# Patient Record
Sex: Female | Born: 1975 | Race: Black or African American | Hispanic: No | Marital: Married | Smoking: Never smoker
Health system: Southern US, Community
[De-identification: ages and names within clinical notes are randomized; demographics above are authoritative.]

## PROBLEM LIST (undated history)

## (undated) DIAGNOSIS — T7840XA Allergy, unspecified, initial encounter: Secondary | ICD-10-CM

## (undated) DIAGNOSIS — E7800 Pure hypercholesterolemia, unspecified: Secondary | ICD-10-CM

## (undated) DIAGNOSIS — D649 Anemia, unspecified: Secondary | ICD-10-CM

## (undated) DIAGNOSIS — I10 Essential (primary) hypertension: Secondary | ICD-10-CM

## (undated) HISTORY — DX: Allergy, unspecified, initial encounter: T78.40XA

## (undated) HISTORY — DX: Pure hypercholesterolemia, unspecified: E78.00

## (undated) HISTORY — DX: Anemia, unspecified: D64.9

## (undated) HISTORY — DX: Essential (primary) hypertension: I10

---

## 2007-11-11 ENCOUNTER — Inpatient Hospital Stay (HOSPITAL_COMMUNITY): Admission: AD | Admit: 2007-11-11 | Discharge: 2007-11-12 | Payer: Self-pay | Admitting: Gynecology

## 2007-11-11 ENCOUNTER — Ambulatory Visit: Payer: Self-pay | Admitting: *Deleted

## 2007-11-29 ENCOUNTER — Ambulatory Visit: Payer: Self-pay | Admitting: *Deleted

## 2007-12-13 ENCOUNTER — Ambulatory Visit: Payer: Self-pay | Admitting: *Deleted

## 2007-12-27 ENCOUNTER — Encounter: Payer: Self-pay | Admitting: Family

## 2007-12-27 ENCOUNTER — Ambulatory Visit: Payer: Self-pay | Admitting: Obstetrics & Gynecology

## 2008-01-03 ENCOUNTER — Ambulatory Visit: Payer: Self-pay | Admitting: Obstetrics & Gynecology

## 2008-01-10 ENCOUNTER — Ambulatory Visit: Payer: Self-pay | Admitting: Obstetrics & Gynecology

## 2008-01-11 ENCOUNTER — Ambulatory Visit: Payer: Self-pay | Admitting: Obstetrics & Gynecology

## 2008-01-11 ENCOUNTER — Encounter: Payer: Self-pay | Admitting: Obstetrics & Gynecology

## 2008-01-11 ENCOUNTER — Inpatient Hospital Stay (HOSPITAL_COMMUNITY): Admission: RE | Admit: 2008-01-11 | Discharge: 2008-01-14 | Payer: Self-pay | Admitting: Obstetrics & Gynecology

## 2008-10-01 ENCOUNTER — Inpatient Hospital Stay (HOSPITAL_COMMUNITY): Admission: AD | Admit: 2008-10-01 | Discharge: 2008-10-01 | Payer: Self-pay | Admitting: Family Medicine

## 2008-11-15 IMAGING — US US OB COMP +14 WK
1 series · 14 of 28 positions shown · non-contrast
Comparison: none

OBSTETRICAL ULTRASOUND:

 This ultrasound exam was performed in the [HOSPITAL] Ultrasound Department.  The OB US report was generated in the AS system, and faxed to the ordering physician.  This report is also available in [REDACTED] PACS.

[Series 1: us ob comp +14 wk · 0.26mm/px · 14 of 57 slices shown]
[im 3/57]
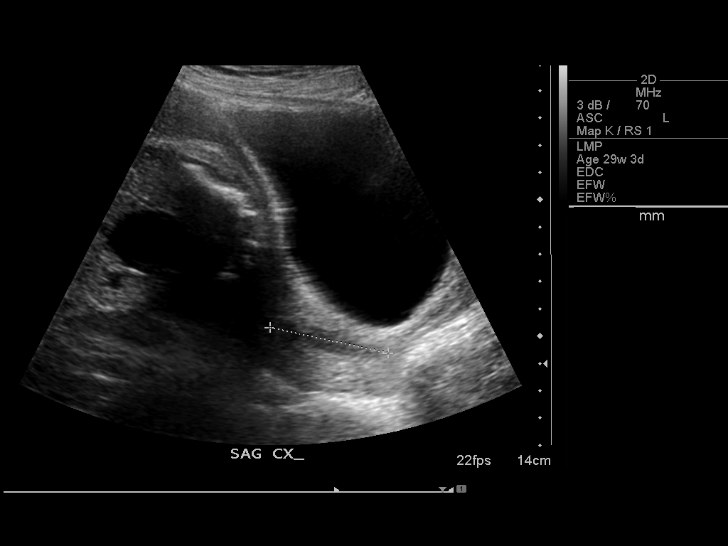
[im 7/57]
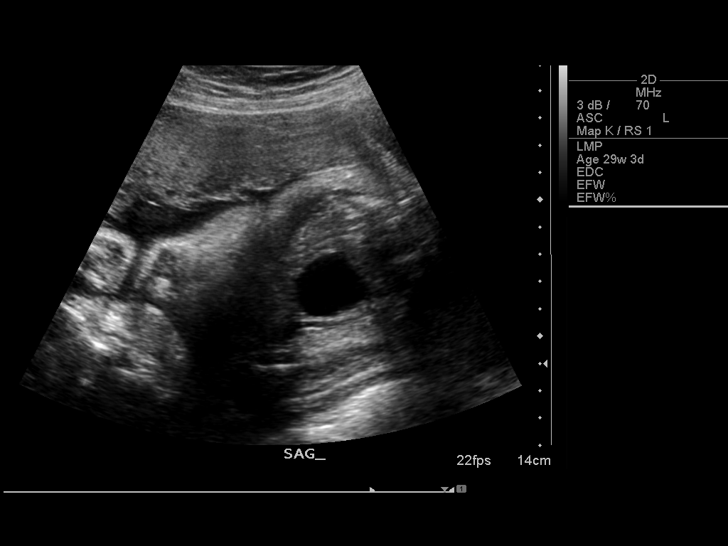
[im 11/57]
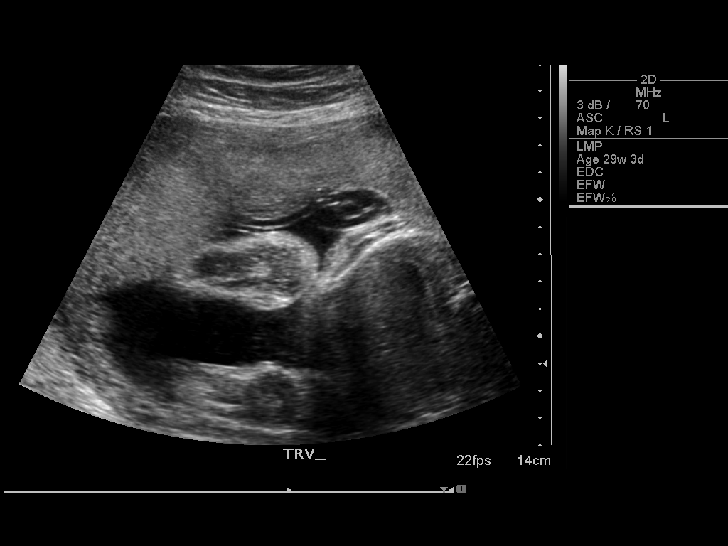
[im 15/57]
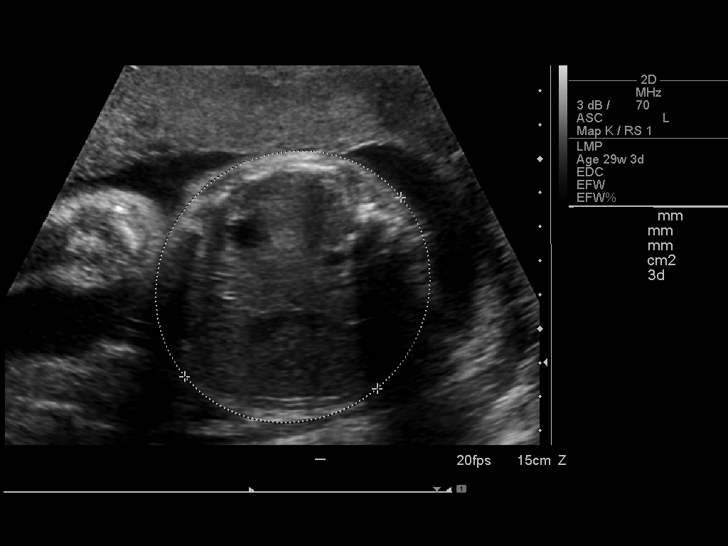
[im 19/57]
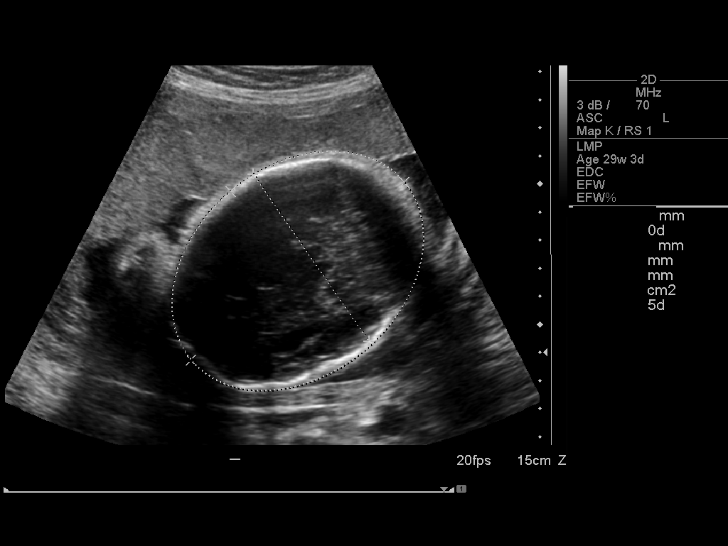
[im 23/57]
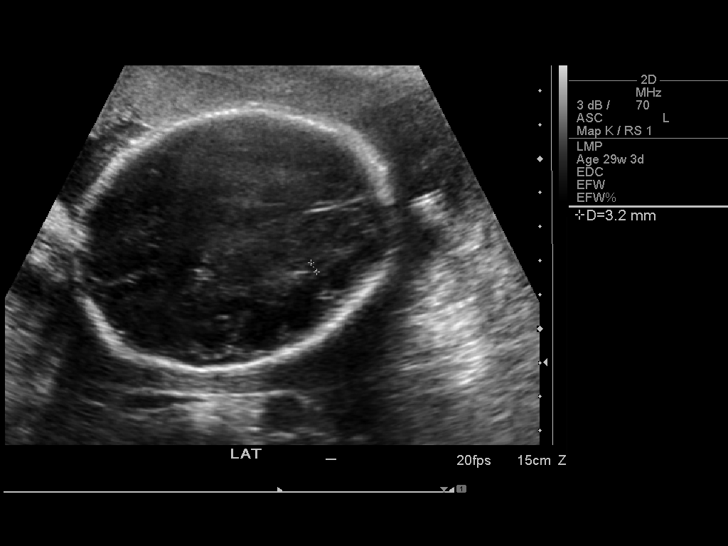
[im 27/57]
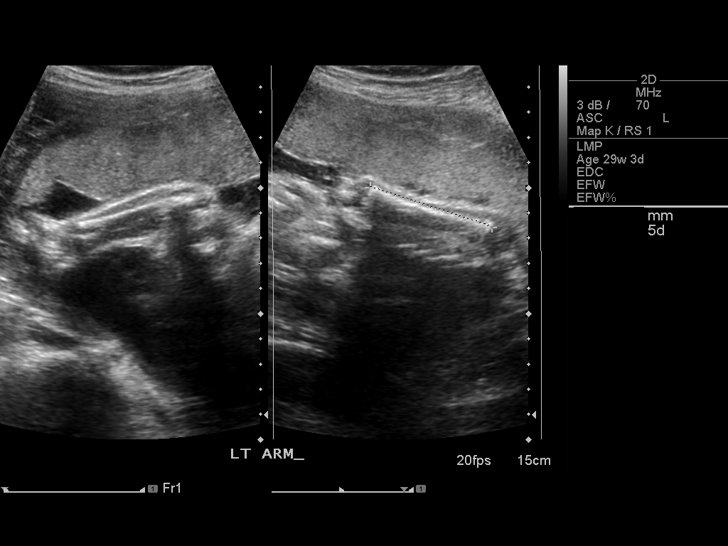
[im 32/57]
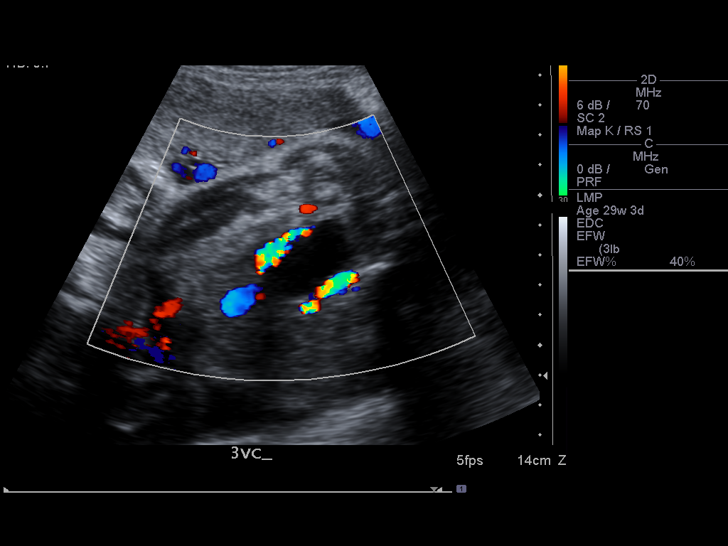
[im 36/57]
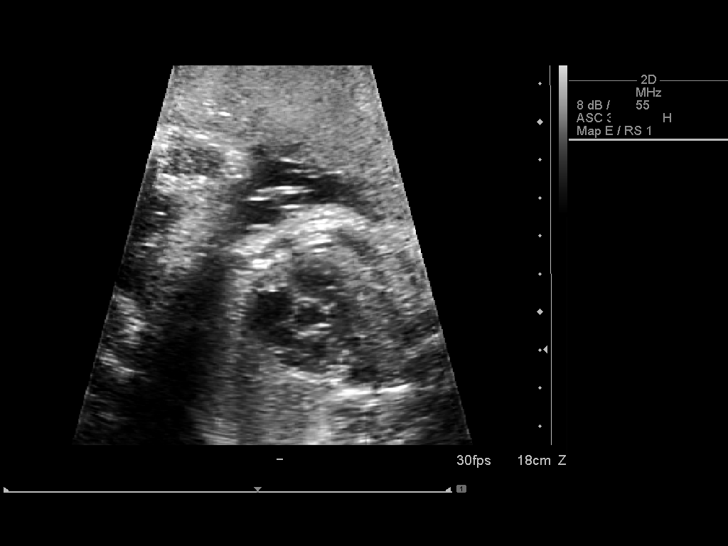
[im 40/57]
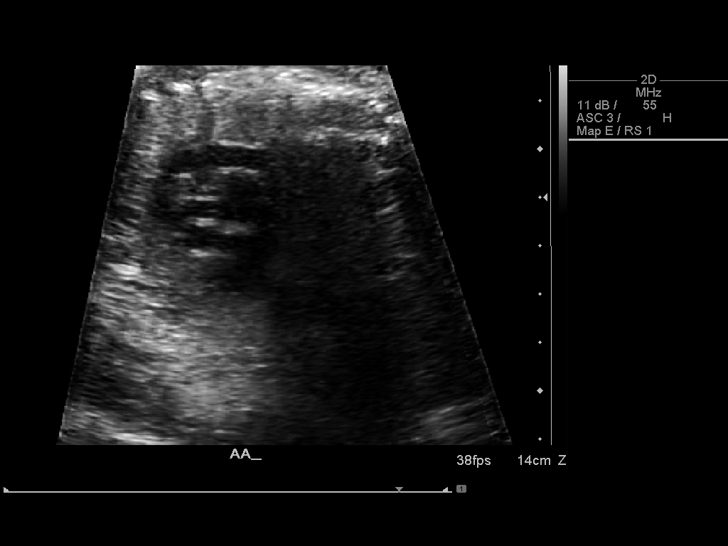
[im 44/57]
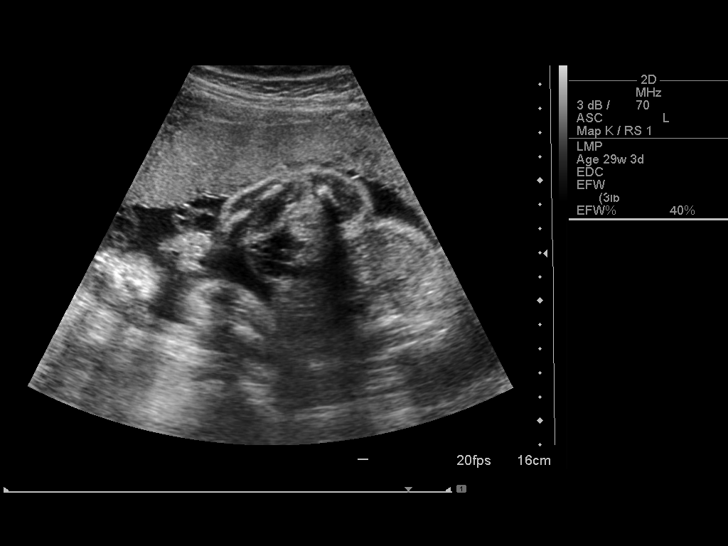
[im 48/57]
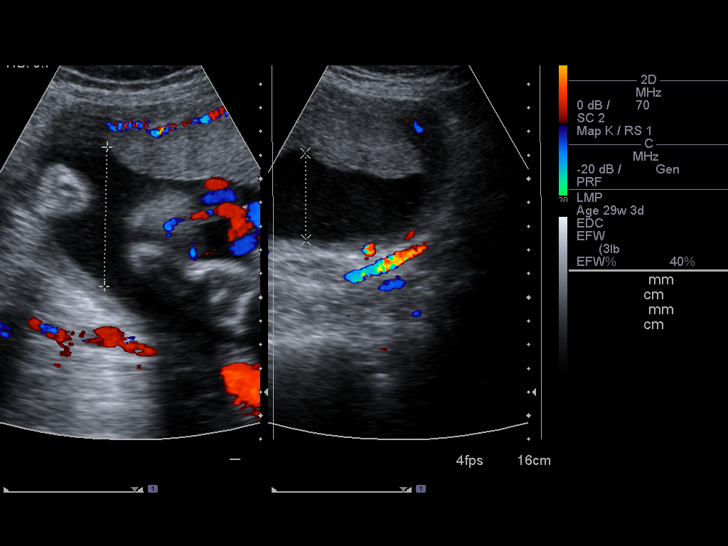
[im 52/57]
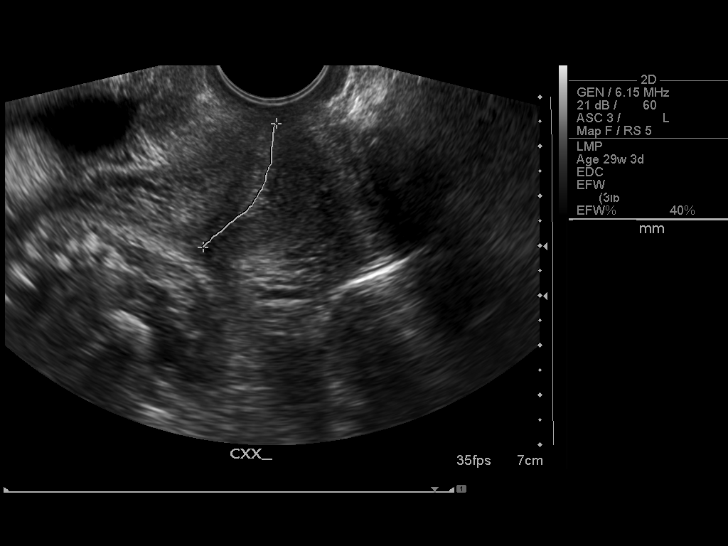
[im 57/57]
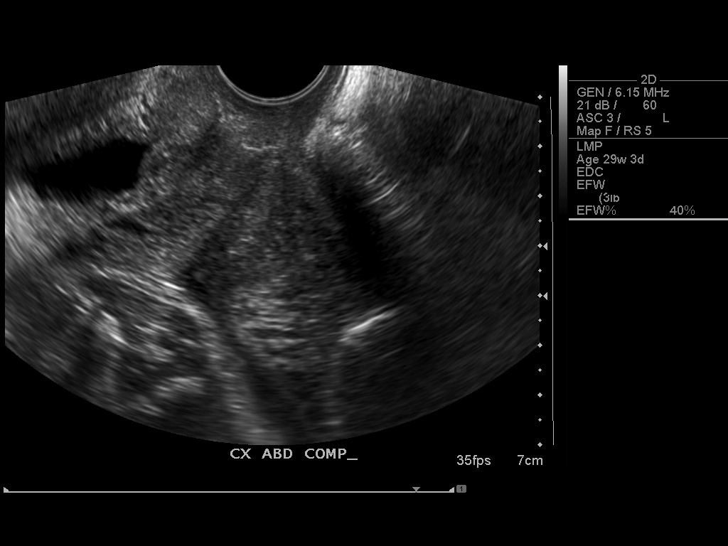

[14 of 28 positions shown; findings below may reference images not displayed]

IMPRESSION: See AS Obstetric US report.

## 2009-01-20 ENCOUNTER — Ambulatory Visit (HOSPITAL_COMMUNITY): Admission: RE | Admit: 2009-01-20 | Discharge: 2009-01-20 | Payer: Self-pay | Admitting: Family Medicine

## 2009-02-05 ENCOUNTER — Ambulatory Visit (HOSPITAL_COMMUNITY): Admission: RE | Admit: 2009-02-05 | Discharge: 2009-02-05 | Payer: Self-pay | Admitting: Family Medicine

## 2009-03-06 ENCOUNTER — Ambulatory Visit (HOSPITAL_COMMUNITY): Admission: RE | Admit: 2009-03-06 | Discharge: 2009-03-06 | Payer: Self-pay | Admitting: Family Medicine

## 2009-03-19 ENCOUNTER — Ambulatory Visit (HOSPITAL_COMMUNITY): Admission: RE | Admit: 2009-03-19 | Discharge: 2009-03-19 | Payer: Self-pay | Admitting: Family Medicine

## 2009-04-10 ENCOUNTER — Ambulatory Visit (HOSPITAL_COMMUNITY): Admission: RE | Admit: 2009-04-10 | Discharge: 2009-04-10 | Payer: Self-pay | Admitting: Family Medicine

## 2009-04-16 ENCOUNTER — Ambulatory Visit: Payer: Self-pay | Admitting: Family Medicine

## 2009-04-16 ENCOUNTER — Inpatient Hospital Stay (HOSPITAL_COMMUNITY): Admission: RE | Admit: 2009-04-16 | Discharge: 2009-04-19 | Payer: Self-pay | Admitting: Family Medicine

## 2010-03-10 IMAGING — US US OB FOLLOW-UP
1 series · 14 of 28 positions shown · non-contrast
Comparison: none

OBSTETRICAL ULTRASOUND:
 This ultrasound was performed in The [HOSPITAL], and the AS OB/GYN report will be stored to [REDACTED] PACS.

[Series 1: us ob follow-up · 14 of 60 slices shown]
[im 3/60]
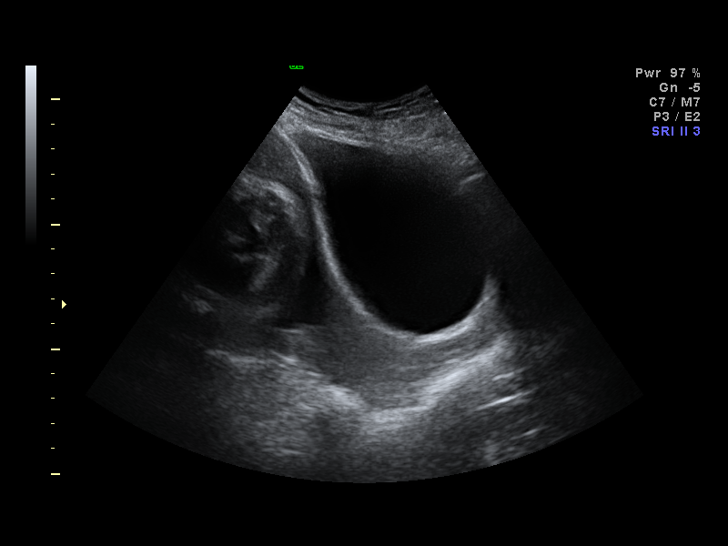
[im 7/60]
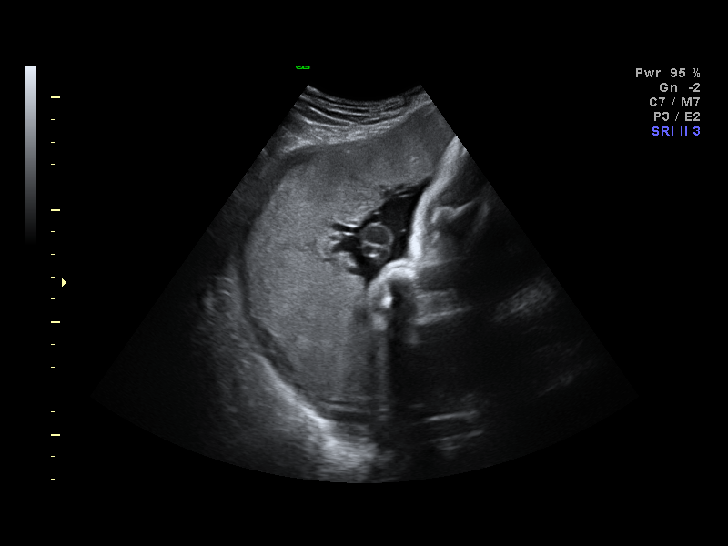
[im 11/60]
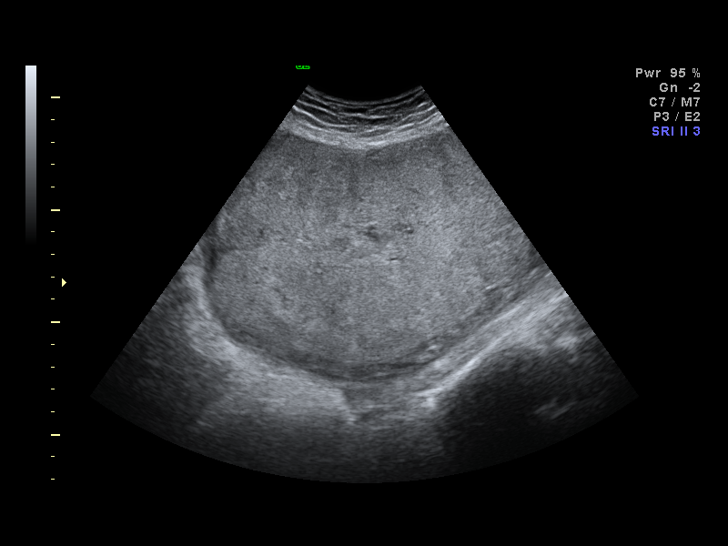
[im 16/60]
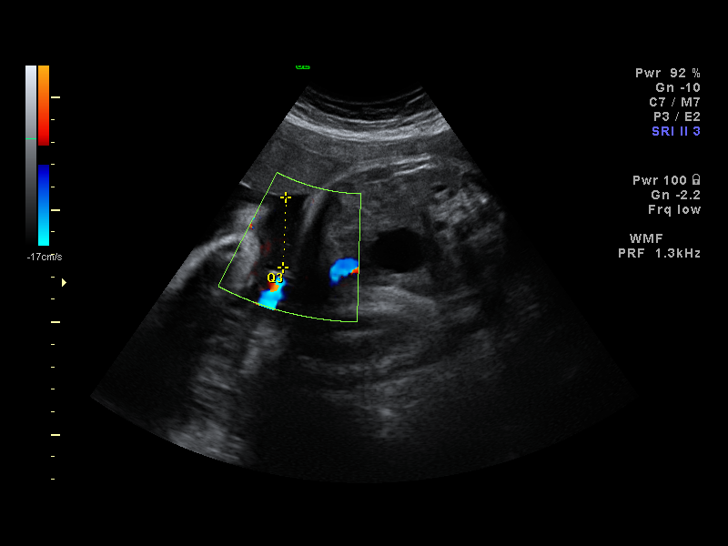
[im 20/60]
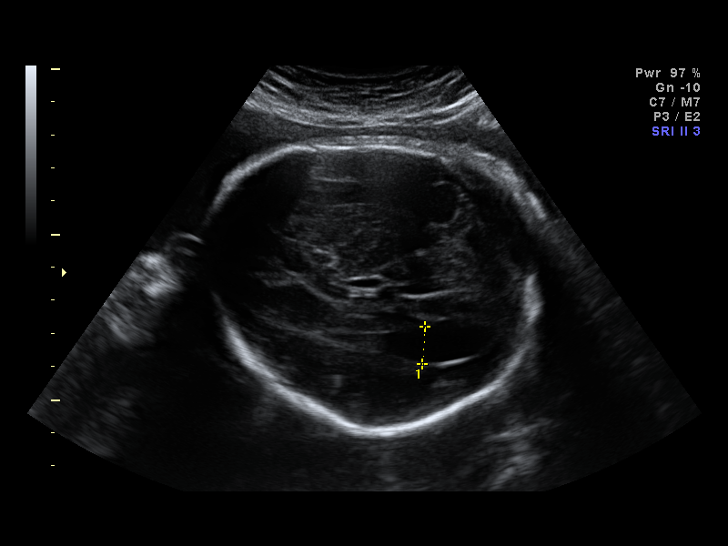
[im 25/60]
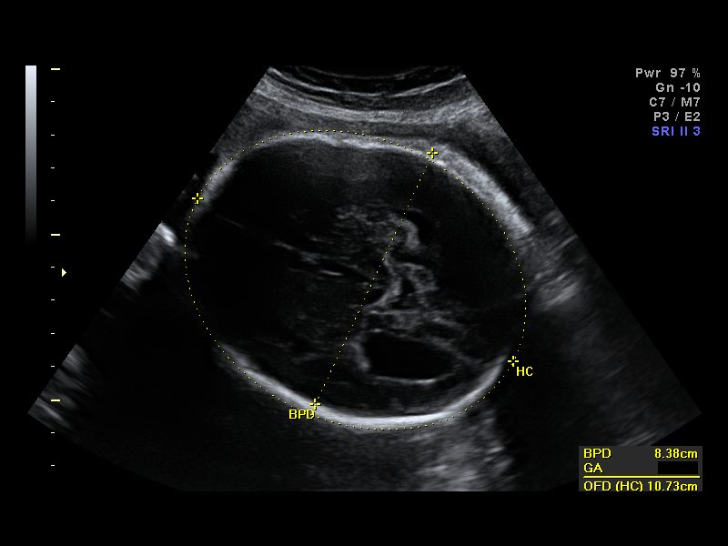
[im 29/60]
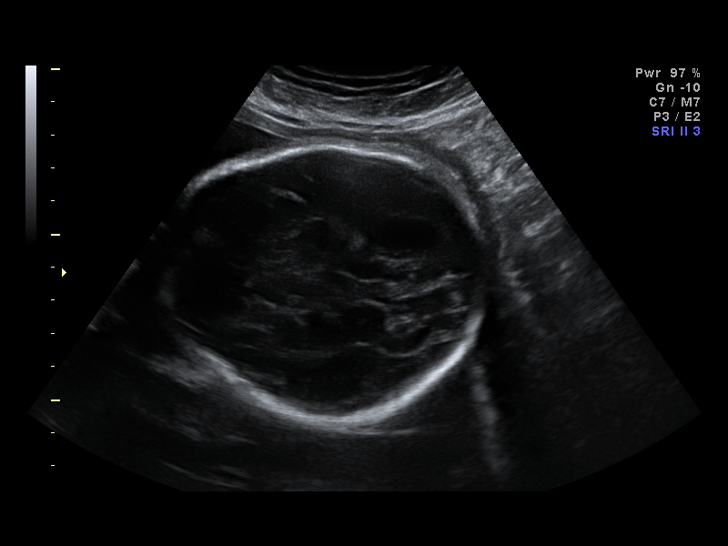
[im 33/60]
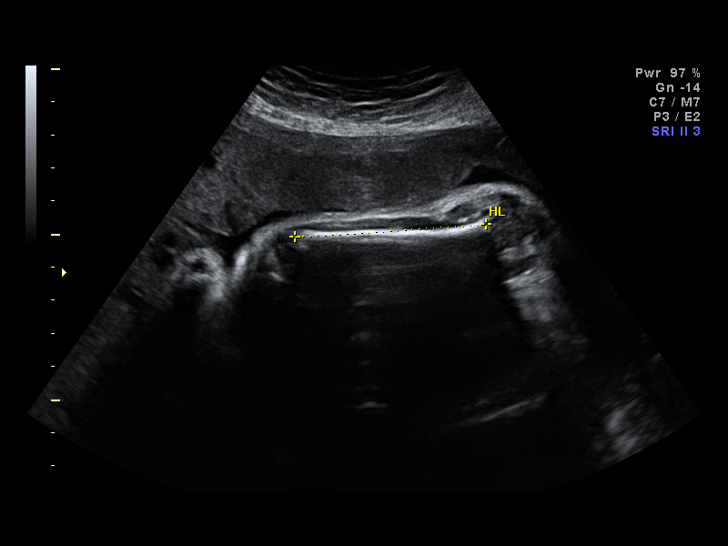
[im 38/60]
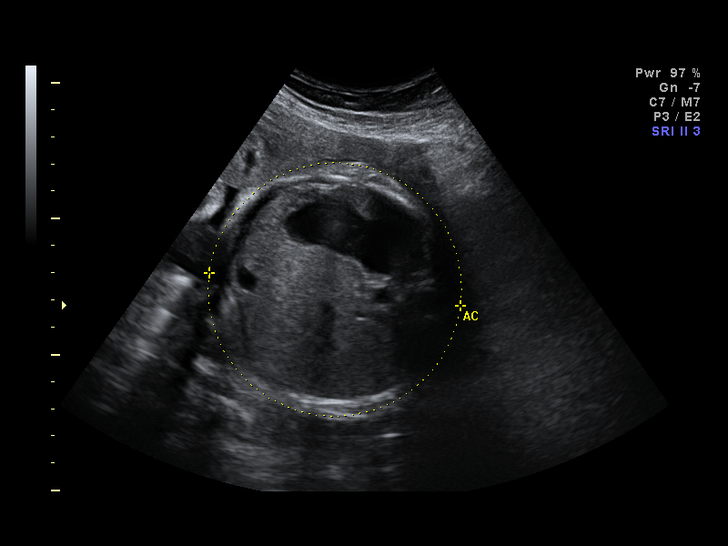
[im 42/60]
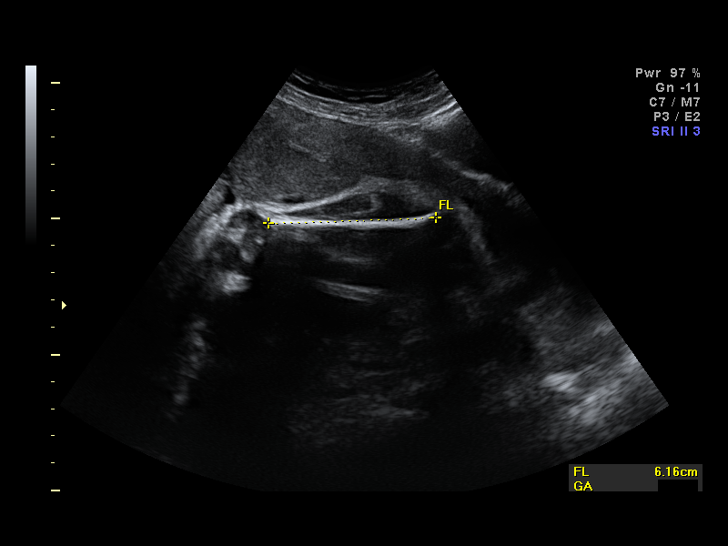
[im 46/60]
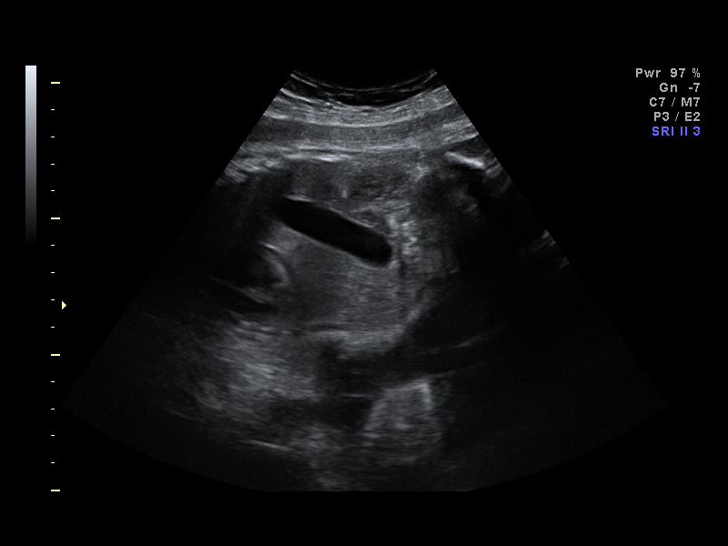
[im 51/60]
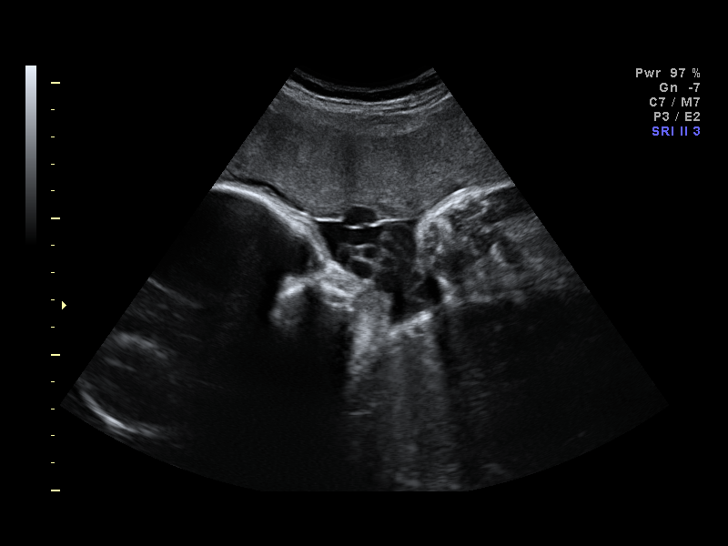
[im 55/60]
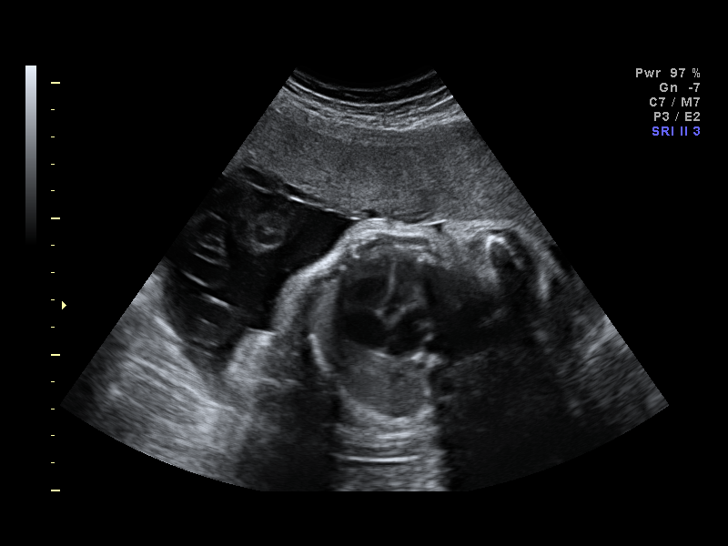
[im 60/60]
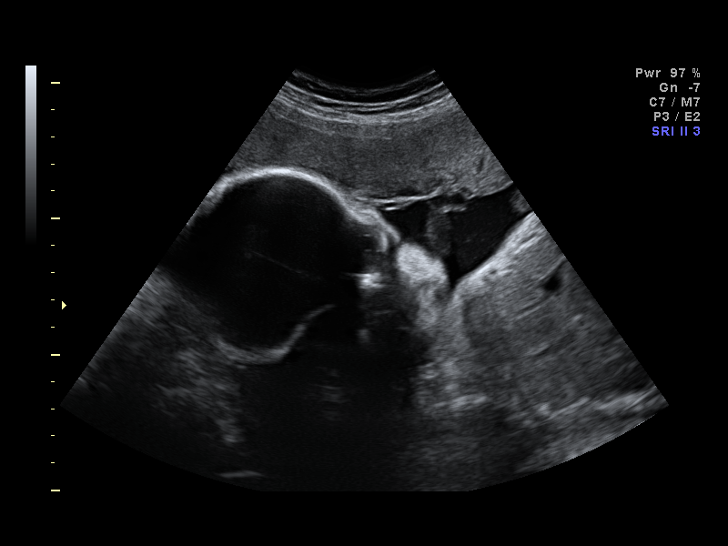

[14 of 28 positions shown; findings below may reference images not displayed]

IMPRESSION: AS OB/GYN has also been faxed to the ordering physician.

## 2010-08-10 ENCOUNTER — Ambulatory Visit: Payer: Self-pay | Admitting: Internal Medicine

## 2011-04-07 LAB — CBC
HCT: 37.6 % (ref 36.0–46.0)
Hemoglobin: 13.2 g/dL (ref 12.0–15.0)
MCV: 91.8 fL (ref 78.0–100.0)
Platelets: 111 10*3/uL — ABNORMAL LOW (ref 150–400)
RBC: 3.74 MIL/uL — ABNORMAL LOW (ref 3.87–5.11)
RBC: 4.1 MIL/uL (ref 3.87–5.11)
RDW: 15.7 % — ABNORMAL HIGH (ref 11.5–15.5)

## 2011-04-08 LAB — TOXOPLASMA ANTIBODIES- IGG AND  IGM
Toxoplasma Antibody- IgM: 0.3 IV
Toxoplasma Antibody- IgM: 0.2 IV
Toxoplasma IgG Ratio: 24.3 IU/mL
Toxoplasma IgG Ratio: 25.4 IU/mL

## 2011-04-08 LAB — CYTOMEGALOVIRUS ANTIBODY, IGG
Cytomegalovirus(CMV) Antibody, IgG: POSITIVE — AB
Cytomegalovirus(CMV) Antibody, IgG: POSITIVE — AB

## 2011-05-11 NOTE — Op Note (Signed)
NAMEZAMORAH, AILES                 ACCOUNT NO.:  [id number]   MEDICAL RECORD NO.:  [id number]          PATIENT TYPE:  INP   LOCATION:  9120                          [facility]   PHYSICIAN:  Tanya S. Shawnie Pons, M.D.   DATE OF BIRTH:  1976/02/06   DATE OF PROCEDURE:  04/16/2009  DATE OF DISCHARGE:                               OPERATIVE REPORT   PREOPERATIVE DIAGNOSES:  1. Intrauterine pregnancy at 39-0/[redacted] weeks gestational age.  2. Late presentation for prenatal care.  3. Breech presentation.  4. History of previous cesarean section.   POSTOPERATIVE DIAGNOSES:  1. Intrauterine pregnancy at 39-0/[redacted] weeks gestational age.  2. Late presentation for prenatal care.  3. Breech presentation.  4. History of previous cesarean section.  5. Loose nuchal cord.   PROCEDURE:  Repeat low-transverse cesarean section.   SURGEON:  Shelbie Proctor. Shawnie Pons, MD   ASSISTANT:  Odie Sera, DO   ANESTHESIA:  Spinal and local.   INDICATIONS FOR PROCEDURE:  Ms. Luddie Boghosian is a 35 year old gravida 3,  now para 2-1-0-3, who has a history of previous cesarean section for  breech presentation is present at 39-0/7 weeks with a breech  presentation for elective repeat low-transverse cesarean section.  She  has previously been counseled the risks and benefits of this procedure  to include, but not limited to bleeding, infection, damage to internal  organs with the potential need for further surgeries.  The patient was  explained these risks, via the separate interpreter line and voiced  understanding these risks and agreed to proceed with the procedure.   DESCRIPTION OF PROCEDURE:  The patient was taken to the operating room  where spinal anesthesia was introduced.  She was then prepped and draped  in the usual sterile manner and a time-out was conducted.  Appropriate  anesthesia was confirmed.  A Pfannenstiel incision was made in the skin  with the scalpel in the area of the previous cesarean scar.  The  incision was carried through subcutaneous layer down to the fascia.  The  fascia was incised in the midline with a scalpel.  The fascial incision  was then extended laterally using Mayo scissors.  The fascia was bluntly  and sharply dissected off the underlying rectus muscles both superior  and inferior to the rectus incision.  The rectus muscles were entered in  the midline bluntly and separated using manual traction.  The peritoneum  was then entered bluntly as well and separated with manual traction.  Appropriate entrance to the uterus was obtained and the Alexis retractor  was placed.  A transverse incision was made in the lower uterine segment  with a scalpel and carried through the layers, the last layer entered  bluntly with a finger.  Clear amniotic fluid was noted.  The uterine  incision was extended laterally using manual traction.  The fetus was  found to be in breech presentation.  The feet were then grasped and  delivered through the uterine incision.  Followed by the hips and  abdomen.  The abdomen was grasped with a wet towel for better traction.  The right arm was delivered by sweeping in front of the abdomen, then  followed by the left arm.  A loose nuchal cord was noted.  The head was  delivered atraumatically in a flexed position without difficulty.  The  mouth and nares were bulb suctioned and a spontaneous cry was noted.  The cord was clamped x2 and cut and the baby handed to the awaiting NICU  staff.  A cord blood was collected.  The placenta delivered with the  assistance of uterine massage.  The placenta was intact and had a three-  vessel cord.  The uterus was then cleared of clots and debris with a dry  lap sponge.  There were no septations or abnormalities noted on either  internally or externally on the uterus.  The uterine incision was then  closed in a single layer closure using a 0 Vicryl suture.  The closure  was done in a running interlocking manner.  Good  hemostasis was noted in  the uterine incision.  The bilateral adnexa and fallopian tubes were  identified and found to be grossly normal.  The Alexis retractor was  removed.  The peritoneum was closed using 0 Vicryl.  Good hemostasis was  noted.  The fascia was then closed using 0 Vicryl in a running non-  interlocking fashion.  There were no defects noted in the fascial  closure.  Good hemostasis was noted.  Several areas of oozing from the  subcutaneous tissues were identified and treated with electrocautery.  The skin was then closed with staples in the usual manner.  The superior  and inferior aspects of the incision were injected with 10 mL each of  0.25% Marcaine without epinephrine.  The same solution was injected with  5 mL at each anterior and superior iliac spine.  Pressure dressing was  applied.  All sponge, needle, and instrument counts were correct x2.   FINDINGS:  1. Clear amniotic fluid.  2. Viable female infant, weight 7 pounds 11 ounces.   SPECIMENS:  Placenta.   DISPOSITION:  To Labor and Delivery.   ESTIMATED BLOOD LOSS:  700 mL.   COMPLICATIONS:  No immediate complication.   The patient was taken to the PACU in good condition.      Odie Sera, DO  Electronically Signed     ______________________________  Shelbie Proctor. Shawnie Pons, M.D.    MC/MEDQ  D:  04/16/2009  T:  04/16/2009  Job:  [id number]

## 2011-05-11 NOTE — Discharge Summary (Signed)
[name], [name]                 ACCOUNT NO.:  [id number]   MEDICAL RECORD NO.:  [id number]          PATIENT TYPE:  INP   LOCATION:  9111                          [facility]   PHYSICIAN:  Lazaro Arms, M.D.   DATE OF BIRTH:  05-Jun-1976   DATE OF ADMISSION:  01/11/2008  DATE OF DISCHARGE:                               DISCHARGE SUMMARY   REASON FOR ADMISSION:  Pregnancy, approximately 38 to 39 weeks, for  failed version and cesarean section from fetal tachycardia.   DISCHARGE DIAGNOSES:  1. Pregnancy, approximately 38 to 39 weeks, for failed version and      cesarean section from fetal tachycardia.  2. Low transverse cesarean section for failed version.   HOSPITAL COURSE:  Has been uneventful.  Patient has progressed well.  Vital signs are stable.  She has been afebrile, up ambulating in halls  without any difficulties.  Her incision is dry and intact, no redness or  swelling or discharge.   PHYSICAL EXAM TODAY:  VITAL SIGNS:  Stable.  HEART:  Regular rhythm and rate.  LUNGS:  Clear to auscultation bilaterally.  ABDOMEN:  Soft, bowel sounds are present in all 4 quadrants.  Incision  is intact, no redness, swelling or discharge, lochia small amount.  There is trace edema in lower extremities.   DISCHARGING MEDICATIONS:  As are follows:  1. Lortab 5/500, one p.o. q.4h. p.r.n. pain.  2. Prenatal Vitamin.  3. Ibuprofen 600, one p.o. q.6h. p.r.n. pain.  4. Colace 100 b.i.d.   DISCHARGE:  She is discharged today [facility] is supposed to see her postop  day 5 through 7 to remove her staples and re-evaluate the patient.      [name], N.M.      Lazaro Arms, M.D.  Electronically Signed    DL/MEDQ  D:  04/54/0981  T:  01/14/2008  Job:  [id number]

## 2011-05-11 NOTE — Op Note (Signed)
NAMEALYDIA, GOSSER                 ACCOUNT NO.:  [id number]   MEDICAL RECORD NO.:  [id number]          PATIENT TYPE:  INP   LOCATION:  9111                          [facility]   PHYSICIAN:  Lesly Dukes, M.D. DATE OF BIRTH:  [birth year]   DATE OF PROCEDURE:  01/11/2008  DATE OF DISCHARGE:                               OPERATIVE REPORT   PREOPERATIVE DIAGNOSES:  1. Intrauterine pregnancy at 66 weeks' gestation.  2. Fetal tachycardia.  3. Breech presentation, status post failed version.   POSTOPERATIVE DIAGNOSES:  1. Intrauterine pregnancy at 33 weeks' gestation.  2. Fetal tachycardia.  3. Breech presentation, status post failed version.   PROCEDURE:  Primary low transverse cesarean section.   SURGEON:  Lesly Dukes, MD   ASSISTANT:  Karlton Lemon, MD   ANESTHESIA:  Epidural.   FINDINGS:  1. A viable infant female, weight 7 pounds 15 ounces, with Apgars of 9      at one minute and 9 at five minutes with a cord pH of 7.25.  2. Clear amniotic fluid.  3. Normal female pelvic anatomy.   ESTIMATED BLOOD LOSS:  800 mL.   DRAINS:  Foley with 200 mL of clear yellow urine.   COMPLICATIONS:  None immediate.   SPECIMENS:  Placenta to labor and delivery.  Cord blood and gas to the  lab.   INDICATION FOR PROCEDURE:  This is a 35 year old gravida 2, para 0-1-0-  1, presenting at 28 weeks' gestation in breech presentation.  She  presented for attempted version, which was unsuccessful.  After the  version there was persistent fetal tachycardia in the 170s to 180s  necessitating the need for a cesarean section.   DESCRIPTION OF PROCEDURE:  The patient was taken to the operating room  and after obtaining adequate epidural anesthesia was prepped and draped  in the usual sterile manner in the supine position with left lateral  uterine displacement.  After assuring adequate anesthesia, a  Pfannenstiel skin incision was made using a scalpel.  The incision was  carried down  through the subcutaneous tissues using a scalpel.  The  rectus fascia was nicked in the midline and the incision was extended  laterally in each direction using Mayo scissors.  The rectus muscle was  dissected free of the fascia using both sharp and blunt dissection.  The  rectus muscles were separated bluntly.  The parietal peritoneum was  identified and grasped between two hemostats, elevated, and entered  under direct visualization with Metzenbaum scissors.  At this point a  bladder blade was placed.  A reflection of the visceral peritoneum  superior to the bladder was identified, elevated, and then incised using  the Metzenbaum scissors and the incision was extended laterally.  The  bladder flap was created using blunt dissection and retracted with the  bladder blade.  A low transverse uterine incision was made using a  scalpel and the incision was extended lateral and superior using blunt  dissection and bandage scissors.  A hand was placed in the uterine  cavity and the infant was found to be  in frank breech position.  Hooking  the hips with index fingers, the buttocks were delivered.  The left leg  was then swept down and out of the uterine cavity.  This was followed by  the right leg being swept out of the uterine cavity.  A dry towel was  used to provide traction and the infant was abdomen and chest were then  delivered.  The infant's right arm was then delivered by sweeping it  down across the chest.  The left arm was then delivered in the same  fashion.  The head was then delivered atraumatically after providing  flexion of the head.  The infant was bulb-suctioned after delivery.  The  cord was doubly clamped and cut and the infant handed to the nursery  team in attendance.  Specimens were collected for cord blood and cord  pH.  The placenta was delivered manually and appeared intact.  The  placenta was delivered by uterine massage and appeared intact.  The  edges of the uterine  incision were then clamped with ring clamps x2.  The endometrial cavity was wiped free of any trace of membranes using  wet laparotomy sponges.  The uterine incision was closed in two layers,  the first being a running locking stitch of 0 Vicryl and the second  being a running imbricating stitch of 0 Vicryl.  The uterine incision  was inspected and found to have adequate hemostasis.  The operative site  was irrigated with copious amounts of normal saline and the uterine  incision was then inspected again and found to have adequate hemostasis.  The rectus muscles were inspected and small areas of bleeding controlled  using Bovie cautery.  The rectus fascia was reapproximated with one  suture of 0 Vicryl in a running, unlocked fashion.  There was a small  area of fascial defect at the area where the lateral rectus muscle  attaches to the fascia.  This was closed with one stitch of 0 Vicryl.  The subcutaneous tissue was then inspected and small areas of bleeding  were controlled using Bovie cautery.  The skin was reapproximated with  stainless steel skin staples.  Sponge, needle and instrument counts were  correct x2.  The patient tolerated the procedure well and went to  recovery in stable condition.      Karlton Lemon, MD  Electronically Signed     ______________________________  Lesly Dukes, M.D.    NS/MEDQ  D:  01/12/2008  T:  01/12/2008  Job:  [id number]

## 2011-05-11 NOTE — Discharge Summary (Signed)
NAMETESNEEM, DUFRANE                 ACCOUNT NO.:  [id number]   MEDICAL RECORD NO.:  [id number]         PATIENT TYPE:  WINP   LOCATION:                                [facility]   PHYSICIAN:  Norton Blizzard, MD    DATE OF BIRTH:  May 30, 1976   DATE OF ADMISSION:  04/16/2009  DATE OF DISCHARGE:  04/19/2009                               DISCHARGE SUMMARY   REASON FOR HOSPITALIZATION:  Repeat low transverse cesarean section  secondary to breech presentation.   PERTINENT LABORATORY DATA:  Admission hemoglobin 13.2, postop hemoglobin  12.1.   FINAL DIAGNOSES:  1. Lower transverse cesarean section.  2. History of previous cesarean section,  3. Breech presentation.  4. Late prenatal care.   SIGNIFICANT FINDINGS:  Viable female infant delivered via C-section on  April 16, 2009, weight 7 pounds 11 ounces, Apgars 8 and 9, loose nuchal  cord. EBL 700 mL.   PROCEDURES PERFORMED:  Lower transverse cesarean section.   POSTPARTUM COURSE:  The patient had a normal postpartum course,  unremarkable without any problems.   CONDITION OF THE PATIENT ON DISCHARGE:  The patient is alert and  oriented x3.  Vital signs were stable, ambulating without difficulty,  reported decreased bleeding, and pain well controlled with pain  medications.  Routine postpartum instructions given to the patient.  The  patient is to follow up at [facility] in 6 weeks  for postpartum visit, incisional staples removed prior to discharge.  She was told to avoid lifting any object greater than 10 pounds.  Prescriptions for Ibuprofen, Percocet, and Micronor given at discharge;  she was instructed to take Micronor everyday at the same time to  maximize efficacy and avoid breakthrough bleeding.      Sid Falcon, CNM      Norton Blizzard, MD  Electronically Signed    WM/MEDQ  D:  04/19/2009  T:  04/19/2009  Job:  [phone]

## 2011-09-16 LAB — CBC
HCT: 30.1 — ABNORMAL LOW
HCT: 35.6 — ABNORMAL LOW
MCHC: 34.8
MCHC: 35
MCHC: 35.1
MCV: 88.7
MCV: 89.5
RBC: 3.78 — ABNORMAL LOW
RDW: 13.6
RDW: 13.6
RDW: 13.8
WBC: 6.2
WBC: 7.7

## 2011-09-16 LAB — POCT URINALYSIS DIP (DEVICE)
Bilirubin Urine: NEGATIVE
Glucose, UA: 100 — AB
Glucose, UA: 500 — AB
Hgb urine dipstick: NEGATIVE
Hgb urine dipstick: NEGATIVE
Ketones, ur: NEGATIVE
Nitrite: NEGATIVE
Operator id: 14811
Specific Gravity, Urine: 1.02
Urobilinogen, UA: 0.2
Urobilinogen, UA: 1
pH: 7

## 2011-09-27 LAB — URINALYSIS, ROUTINE W REFLEX MICROSCOPIC
Glucose, UA: 250 — AB
Ketones, ur: 40 — AB
Leukocytes, UA: NEGATIVE
Protein, ur: 30 — AB
Urobilinogen, UA: 1

## 2011-09-27 LAB — URINE CULTURE
Colony Count: NO GROWTH
Culture: NO GROWTH

## 2011-09-27 LAB — WET PREP, GENITAL: Yeast Wet Prep HPF POC: NONE SEEN

## 2011-09-27 LAB — GC/CHLAMYDIA PROBE AMP, GENITAL: Chlamydia, DNA Probe: NEGATIVE

## 2011-09-27 LAB — URINE MICROSCOPIC-ADD ON

## 2011-10-01 LAB — POCT URINALYSIS DIP (DEVICE)
Bilirubin Urine: NEGATIVE
Bilirubin Urine: NEGATIVE
Ketones, ur: NEGATIVE
Ketones, ur: NEGATIVE
Operator id: 120861
Operator id: 148111
Specific Gravity, Urine: 1.015
Specific Gravity, Urine: 1.02

## 2011-10-04 LAB — POCT URINALYSIS DIP (DEVICE)
Bilirubin Urine: NEGATIVE
Glucose, UA: 500 — AB
Ketones, ur: NEGATIVE
Operator id: 297281
Specific Gravity, Urine: 1.025

## 2011-10-05 LAB — URINALYSIS, ROUTINE W REFLEX MICROSCOPIC
Ketones, ur: NEGATIVE
Nitrite: NEGATIVE
Specific Gravity, Urine: <1.005 — ABNORMAL LOW
pH: 6.5

## 2011-10-05 LAB — RAPID URINE DRUG SCREEN, HOSP PERFORMED
Cocaine: NOT DETECTED
Tetrahydrocannabinol: NOT DETECTED

## 2011-10-05 LAB — CBC
HCT: 35.3 — ABNORMAL LOW
Platelets: 147 — ABNORMAL LOW
RBC: 3.88
WBC: 5.8

## 2011-10-05 LAB — DIFFERENTIAL
Eosinophils Relative: 2
Lymphocytes Relative: 25
Lymphs Abs: 1.4
Neutrophils Relative %: 63

## 2011-10-05 LAB — SICKLE CELL SCREEN: Sickle Cell Screen: NEGATIVE

## 2011-10-05 LAB — HEPATITIS B SURFACE ANTIGEN: Hepatitis B Surface Ag: NEGATIVE

## 2011-10-05 LAB — GC/CHLAMYDIA PROBE AMP, GENITAL: GC Probe Amp, Genital: NEGATIVE

## 2011-10-05 LAB — WET PREP, GENITAL

## 2011-10-05 LAB — TYPE AND SCREEN: ABO/RH(D): B POS

## 2011-10-05 LAB — RPR: RPR Ser Ql: NONREACTIVE

## 2011-10-05 LAB — URINE MICROSCOPIC-ADD ON

## 2018-04-17 ENCOUNTER — Ambulatory Visit: Payer: Medicaid Other | Admitting: Family Medicine

## 2018-04-17 ENCOUNTER — Encounter: Payer: Self-pay | Admitting: Family Medicine

## 2018-04-17 ENCOUNTER — Other Ambulatory Visit: Payer: Self-pay

## 2018-04-17 VITALS — BP 110/64 | HR 87 | Temp 98.2°F | Ht 67.5 in | Wt 159.0 lb

## 2018-04-17 DIAGNOSIS — I10 Essential (primary) hypertension: Secondary | ICD-10-CM

## 2018-04-17 DIAGNOSIS — E7800 Pure hypercholesterolemia, unspecified: Secondary | ICD-10-CM | POA: Insufficient documentation

## 2018-04-17 MED ORDER — PRAVASTATIN SODIUM 10 MG PO TABS: 10.0000 mg | ORAL_TABLET | Freq: Every day | ORAL | 2 refills | Status: DC

## 2018-04-17 NOTE — Patient Instructions (Addendum)
Thank you for coming to see me today. It was a pleasure meeting you! Today we talked about:   Your high blood pressure.  Please come in sometime this week for nurse visit in order to have your blood pressure checked without taking your medication.  At that time please also bring in your birth control so that I may refill this for you.   We will call you with your lab results.  Please also see 1 of the travel clinics given a handout in order to receive your vaccinations and malaria prophylaxis.  Please follow-up with me in 6 months or sooner as needed.  If you have any questions or concerns, please do not hesitate to call the office at [phone].  Take Care,   [place] Javonn Gauger, DO  International Travel  The primary function of the International Travel Program is to provide customized travel health consultations to individuals traveling abroad. Destination specific immunizations are administered and preventative medicine services are offered. Additionally, the latest health and safety recommendations issued by the Centers for Disease Control for each destination is provided. We offer travel services for individual vacationers, business travelers, church groups and missionary teams. Services include immunizations, international certificate of vaccination, malaria prevention medication, and travel consultation. Consultations are with experienced nursing staff and include travel packets of reference information and the most current travel information from: the Centers for Disease Control (CDC); the World Health Organization (WHO); the [place] State Department and the [employer] Administration (TSA).  To better serve you, we accept the following insurances: [employer] (BCBS) and [employer]. If you have BCBS or UnitedHealthcare, you may be responsible for your deductible and/or co-payment at the time of service. Aetna, Cigna and [employer]state. Patients with these insurance  plans will need to pay for their visit in full at the time of service. A claim will be filed on the patient's behalf and if payment is received, the patient will receive a refund. As a result of your clinic visit and if you are eligible, some travel medications may also be available for you at reduced costs from the [facility]. Please note that our [facility] does not accept insurance prescription cards.  Early Planning  Early planning for travel is the best prevention and protection against disease. Some immunizations are a 2 or 3 dose series that are given over a specific period of time. It is important to complete the series in order of have full protection against disease. It is best to allow at least three to six months for all necessary immunizations. Fees charged for travel services are competitive for this comprehensive, professional service. Cash, checks, and the above insurances are accepted. You do not have to be a [facility] resident to be eligible for these services. We are unable to offer consultations by phone; however, more information on recommended travel immunizations is available through the [facility] Traveler's Health website or by calling toll free 1-877-FYI-TRIP 479-144-4811). A toll-free fax number for requesting information is [phone].  Clinic Days and Times For your convenience, we offer services in both our [place] and [address] locations. In [place] we are located at [employer]. Our [place] clinic is located at [address] Call [phone], Monday-Friday for individual appointments. For more information on disease outbreaks around the world, visit Health Map. International Travel Questions: Do you have questions about our International Travel services? Please Submit your questions. Our staff will respond to your question as  soon as possible.  Are you a previous International Travel customer? If so,  complete our Services Survey and let us know how we can improve!  To respond by mail, please click on the survey below, download and print it, complete it and return it by mail to Mariel AloeLora Coffey, [facility] [employer], [id number], [place], [place] [id number] Services Survey To respond via email, please click on the survey below, save it to your computer, open it in the [employer] Reader program, complete it and then email it back as an attachment to [email or web address] Email Services Survey

## 2018-04-17 NOTE — Assessment & Plan Note (Signed)
Per patient history.  She has been on pravastatin for years.  We will continue pravastatin 10 mg and will obtain lipid panel this morning.

## 2018-04-17 NOTE — Progress Notes (Signed)
Subjective:    Patient ID: [name], female    DOB: 1976-05-11, [age] y.o.   MRN: [id number]   CC: establish care   HPI:  Health Maintenance: Reports that Pap was last year in [place] in 2018, and was normal Patient going to [place] in 2 months.  Reports that she goes every other year. She needs malaria prophylaxis and meningitis vaccinations.   Hypertension: - Medications: Previously on lisinopril 20 mg and HCTZ 25 mg.  Patient reports that this was started 3 months ago due to low blood pressure.  However she reports that she thinks she still needs this.  She took her medications this morning but has not taken it since she was told to stop 3 months ago.  BP today is 110/64 and patient encouraged to return for nurse visit for BP check when off medication.    Hypercholesterolemia: - Patient taking pravastatin 10 mg daily  Trouble hearing in R ear: Patient reports it has been getting slowly worse, denies any other symptoms or pain - Denies tinnitus.   Review of Systems  Constitutional: Negative for fever.  HENT: Positive for hearing loss. Negative for congestion.        In R ear  Eyes: Negative for blurred vision and double vision.  Respiratory: Negative for shortness of breath.   Cardiovascular: Negative for chest pain.  Gastrointestinal: Negative for abdominal pain.  Neurological: Negative for dizziness and headaches.   Patient Active Problem List   Diagnosis Date Noted  . Pure hypercholesterolemia 04/17/2018     Family History  Problem Relation Age of Onset  . High blood pressure Mother     Past Medical History:  Diagnosis Date  . Hypercholesteremia   . Hypertension     Social Hx: Denies tobacco use, alcohol use, or illicit drug use.   Objective:  BP 110/64   Pulse 87   Temp 98.2 F (36.8 C) (Oral)   Ht 5' 7.5" (1.715 m)   Wt 159 lb (72.1 kg)   LMP 04/09/2018 (Exact Date)   SpO2 99%   BMI 24.54 kg/m  Vitals and nursing note reviewed  General: NAD,  pleasant Ears: R ear with cerumen impaction, L ear with normal TM's and no erythema Head: Atraumatic Neck: Supple Cardiac: RRR, normal heart sounds, no murmurs Respiratory: CTAB, normal effort Abdomen: soft, nontender, nondistended. Bowel sounds present Extremities: no edema or cyanosis. WWP. MSK: normal gait Skin: warm and dry, no rashes noted Neuro: alert and oriented, no focal deficits Psych: Neatly groomed and appropriately dressed. Maintains good eye contact and is cooperative and attentive. Speech is normal volume and rate. Denies SI/ HI. Normal affect.  Assessment & Plan:    Pure hypercholesterolemia Per patient history.  She has been on pravastatin for years.  We will continue pravastatin 10 mg and will obtain lipid panel this morning.  HTN:  patient to return for nurse visit for blood pressure check.  She is encouraged not to take her medication on the day of blood pressure check.  We will not refill at this time given blood pressure today of 110/64. Will obtain CMP, CBC and Lipid panel today.   Health Maintenance: Patient is to return with name of OCPs that she is on.  Reports that she had a normal Pap smear 1 year ago.  Resources on travel clinics to go to in order to receive proper travel prophylaxis and vaccinations prior to going to [place]  R ear cleaned out with water irrigation. Patient reports  relief of trouble hearing. Ear with normal TM and no erythema after cleaning.   Crystal Mauri Tolen, DO Family Medicine Resident, PGY-1

## 2018-04-18 LAB — COMPREHENSIVE METABOLIC PANEL
ALT: 20 IU/L (ref 0–32)
AST: 17 IU/L (ref 0–40)
Albumin/Globulin Ratio: 1.4 (ref 1.2–2.2)
Albumin: 4.6 g/dL (ref 3.5–5.5)
Alkaline Phosphatase: 73 IU/L (ref 39–117)
BUN/Creatinine Ratio: 10 (ref 9–23)
BUN: 7 mg/dL (ref 6–24)
Bilirubin Total: 0.4 mg/dL (ref 0.0–1.2)
CALCIUM: 10.7 mg/dL — AB (ref 8.7–10.2)
CO2: 25 mmol/L (ref 20–29)
CREATININE: 0.7 mg/dL (ref 0.57–1.00)
Chloride: 98 mmol/L (ref 96–106)
GFR, EST AFRICAN AMERICAN: 123 mL/min/{1.73_m2} (ref >59)
GFR, EST NON AFRICAN AMERICAN: 106 mL/min/{1.73_m2} (ref >59)
GLOBULIN, TOTAL: 3.2 g/dL (ref 1.5–4.5)
Glucose: 103 mg/dL — ABNORMAL HIGH (ref 65–99)
Potassium: 4.9 mmol/L (ref 3.5–5.2)
SODIUM: 138 mmol/L (ref 134–144)
Total Protein: 7.8 g/dL (ref 6.0–8.5)

## 2018-04-18 LAB — LIPID PANEL
CHOL/HDL RATIO: 5.2 ratio — AB (ref 0.0–4.4)
Cholesterol, Total: 234 mg/dL — ABNORMAL HIGH (ref 100–199)
HDL: 45 mg/dL (ref >39)
LDL CALC: 157 mg/dL — AB (ref 0–99)
Triglycerides: 159 mg/dL — ABNORMAL HIGH (ref 0–149)
VLDL Cholesterol Cal: 32 mg/dL (ref 5–40)

## 2018-04-18 LAB — CBC
HEMOGLOBIN: 15.1 g/dL (ref 11.1–15.9)
Hematocrit: 44.1 % (ref 34.0–46.6)
MCH: 29.7 pg (ref 26.6–33.0)
MCHC: 34.2 g/dL (ref 31.5–35.7)
MCV: 87 fL (ref 79–97)
Platelets: 298 10*3/uL (ref 150–379)
RBC: 5.08 x10E6/uL (ref 3.77–5.28)
RDW: 13.9 % (ref 12.3–15.4)
WBC: 4.4 10*3/uL (ref 3.4–10.8)

## 2018-04-19 ENCOUNTER — Telehealth: Payer: Self-pay | Admitting: Family Medicine

## 2018-04-19 NOTE — Telephone Encounter (Signed)
Patient called and informed of her normal CBC and CMP.  Patient also informed that her cholesterol levels are still elevated despite being on pravastatin 10 mg.  Patient instructed to take 2 tablets/day at 20 mg total.  Patient voiced understanding and will return on Monday for a nurse visit and bring her birth control at that time so that I can refill the proper prescription.

## 2018-04-24 ENCOUNTER — Telehealth: Payer: Self-pay

## 2018-04-24 ENCOUNTER — Other Ambulatory Visit: Payer: Self-pay | Admitting: Family Medicine

## 2018-04-24 ENCOUNTER — Ambulatory Visit (INDEPENDENT_AMBULATORY_CARE_PROVIDER_SITE_OTHER): Payer: Medicaid Other

## 2018-04-24 VITALS — BP 130/90 | HR 82

## 2018-04-24 DIAGNOSIS — I10 Essential (primary) hypertension: Secondary | ICD-10-CM

## 2018-04-24 MED ORDER — NORETHIN-ETH ESTRAD-FE BIPHAS 1 MG-10 MCG / 10 MCG PO TABS: 1.0000 | ORAL_TABLET | Freq: Every day | ORAL | 4 refills | Status: DC

## 2018-04-24 NOTE — Progress Notes (Signed)
Patient here today for BP check.    BP today is 130/90.  Checked BP in left arm with regular cuff.  Symptoms present: none.  Patient not currently taking BP medications. Routed note to PCP.    Shawna Orleans, RN

## 2018-04-24 NOTE — Telephone Encounter (Signed)
Pt came in for BP check, is requesting Refill of Lo Lo Estrin sent to [facility]. Shawna Orleans, RN

## 2018-04-24 NOTE — Progress Notes (Signed)
Patient given refill of OCP- lo loestrin for 1 year supply

## 2018-05-11 ENCOUNTER — Emergency Department (HOSPITAL_COMMUNITY)
Admission: EM | Admit: 2018-05-11 | Discharge: 2018-05-11 | Disposition: A | Discharge status: Home / Self Care | Payer: Medicaid Other | Attending: Emergency Medicine | Admitting: Emergency Medicine

## 2018-05-11 ENCOUNTER — Emergency Department (HOSPITAL_COMMUNITY): Payer: Medicaid Other

## 2018-05-11 ENCOUNTER — Other Ambulatory Visit: Payer: Self-pay

## 2018-05-11 ENCOUNTER — Encounter (HOSPITAL_COMMUNITY): Payer: Self-pay | Admitting: Emergency Medicine

## 2018-05-11 DIAGNOSIS — M7918 Myalgia, other site: Secondary | ICD-10-CM

## 2018-05-11 DIAGNOSIS — Y92410 Unspecified street and highway as the place of occurrence of the external cause: Secondary | ICD-10-CM | POA: Insufficient documentation

## 2018-05-11 DIAGNOSIS — Y9389 Activity, other specified: Secondary | ICD-10-CM | POA: Diagnosis not present

## 2018-05-11 DIAGNOSIS — Z79899 Other long term (current) drug therapy: Secondary | ICD-10-CM | POA: Diagnosis not present

## 2018-05-11 DIAGNOSIS — I10 Essential (primary) hypertension: Secondary | ICD-10-CM | POA: Insufficient documentation

## 2018-05-11 DIAGNOSIS — Y999 Unspecified external cause status: Secondary | ICD-10-CM | POA: Diagnosis not present

## 2018-05-11 LAB — BASIC METABOLIC PANEL
Anion gap: 9 (ref 5–15)
CALCIUM: 9.2 mg/dL (ref 8.9–10.3)
CO2: 22 mmol/L (ref 22–32)
CREATININE: 0.76 mg/dL (ref 0.44–1.00)
Chloride: 108 mmol/L (ref 101–111)
GFR calc Af Amer: >60 mL/min (ref >60)
GLUCOSE: 122 mg/dL — AB (ref 65–99)
POTASSIUM: 3.2 mmol/L — AB (ref 3.5–5.1)
Sodium: 139 mmol/L (ref 135–145)

## 2018-05-11 LAB — CBC
HEMATOCRIT: 42.1 % (ref 36.0–46.0)
Hemoglobin: 14.3 g/dL (ref 12.0–15.0)
MCH: 28.8 pg (ref 26.0–34.0)
MCHC: 34 g/dL (ref 30.0–36.0)
MCV: 84.7 fL (ref 78.0–100.0)
Platelets: 275 10*3/uL (ref 150–400)
RBC: 4.97 MIL/uL (ref 3.87–5.11)
RDW: 13.5 % (ref 11.5–15.5)
WBC: 5.7 10*3/uL (ref 4.0–10.5)

## 2018-05-11 LAB — I-STAT TROPONIN, ED: Troponin i, poc: 0 ng/mL (ref 0.00–0.08)

## 2018-05-11 LAB — I-STAT BETA HCG BLOOD, ED (MC, WL, AP ONLY)

## 2018-05-11 MED ORDER — ACETAMINOPHEN 325 MG PO TABS
650.0000 mg | ORAL_TABLET | Freq: Once | ORAL | Status: AC
Start: 2018-05-11 — End: 2018-05-11
  Administered 2018-05-11: 650 mg via ORAL
  Filled 2018-05-11: qty 2
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
## 2018-05-11 NOTE — Discharge Instructions (Addendum)
Please read attached information. If you experience any new or worsening signs or symptoms please return to the emergency room for evaluation. Please follow-up with your primary care provider or specialist as discussed.  °
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
## 2018-05-11 NOTE — ED Notes (Signed)
ED Provider at bedside. 

## 2018-05-11 NOTE — ED Notes (Signed)
Patient transported to X-ray 

## 2018-05-11 NOTE — ED Notes (Signed)
Pt is in peds with her children according to family in waiting room.

## 2019-02-05 ENCOUNTER — Other Ambulatory Visit: Payer: Self-pay | Admitting: Family Medicine

## 2019-02-05 DIAGNOSIS — Z1231 Encounter for screening mammogram for malignant neoplasm of breast: Secondary | ICD-10-CM

## 2019-02-16 ENCOUNTER — Encounter: Payer: Medicaid Other | Admitting: Family Medicine

## 2019-03-08 ENCOUNTER — Other Ambulatory Visit: Payer: Self-pay

## 2019-03-08 ENCOUNTER — Ambulatory Visit
Admission: RE | Admit: 2019-03-08 | Discharge: 2019-03-08 | Disposition: A | Discharge status: Home / Self Care | Payer: Medicaid Other | Source: Ambulatory Visit | Attending: Oncology | Admitting: Oncology

## 2019-03-08 DIAGNOSIS — Z1231 Encounter for screening mammogram for malignant neoplasm of breast: Secondary | ICD-10-CM
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
## 2020-04-09 ENCOUNTER — Ambulatory Visit (INDEPENDENT_AMBULATORY_CARE_PROVIDER_SITE_OTHER): Payer: Medicaid Other | Admitting: Family Medicine

## 2020-04-09 ENCOUNTER — Other Ambulatory Visit: Payer: Self-pay

## 2020-04-09 ENCOUNTER — Encounter: Payer: Self-pay | Admitting: Family Medicine

## 2020-04-09 ENCOUNTER — Other Ambulatory Visit: Payer: Self-pay | Admitting: Family Medicine

## 2020-04-09 VITALS — BP 112/80 | HR 81 | Ht 68.0 in | Wt 168.2 lb

## 2020-04-09 DIAGNOSIS — Z23 Encounter for immunization: Secondary | ICD-10-CM | POA: Diagnosis not present

## 2020-04-09 DIAGNOSIS — E7800 Pure hypercholesterolemia, unspecified: Secondary | ICD-10-CM

## 2020-04-09 DIAGNOSIS — L700 Acne vulgaris: Secondary | ICD-10-CM | POA: Diagnosis not present

## 2020-04-09 DIAGNOSIS — Z3041 Encounter for surveillance of contraceptive pills: Secondary | ICD-10-CM

## 2020-04-09 DIAGNOSIS — Z7184 Encounter for health counseling related to travel: Secondary | ICD-10-CM | POA: Diagnosis not present

## 2020-04-09 MED ORDER — LO LOESTRIN FE 1 MG-10 MCG / 10 MCG PO TABS: 1.0000 | ORAL_TABLET | Freq: Every day | ORAL | 4 refills | Status: DC

## 2020-04-09 MED ORDER — PRAVASTATIN SODIUM 10 MG PO TABS: 10.0000 mg | ORAL_TABLET | Freq: Every day | ORAL | 2 refills | Status: DC

## 2020-04-09 MED ORDER — TYPHOID VACCINE PO CPDR: 1.0000 | DELAYED_RELEASE_CAPSULE | ORAL | 0 refills | Status: DC

## 2020-04-09 MED ORDER — ATOVAQUONE-PROGUANIL HCL 250-100 MG PO TABS: ORAL_TABLET | ORAL | 0 refills | Status: DC

## 2020-04-09 NOTE — Progress Notes (Signed)
   SUBJECTIVE:   CHIEF COMPLAINT / HPI:   Acne: Patient is concerned because she has been breaking out around her face where it is covered.  She states she has not tried anything but is interested in trying something at this time.  Hyperlipidemia: - Patient taking pravastatin 10 mg daily and is having no side effects of this medication.  She would like to continue with this medication and will need a repeat lipid panel today.  Travel to [place]: Patient will be going to [place] June 1 through August 10.  She attempted to schedule an appointment with the health department in order to receive her appropriate medications prior to going but was unable to get an appointment.  She is here today in order to get her malaria prophylaxis as well as typhoid and any vaccine she may need.  Health Maintenance: Reports that Pap was last year in [place] in 2018, and was normal. Patient offered Pap smear today but reports that she would like to delay this until she goes to [place].  She may have it done there but would at least prefer to have it done after she returns.  She will schedule at that time.  Patient also due for tetanus shot.  PERTINENT  PMH / PSH: Hypercholesterolemia  OBJECTIVE:  BP 112/80   Pulse 81   Ht 5\' 8"  (1.727 m)   Wt 168 lb 4 oz (76.3 kg)   LMP 03/22/2020   SpO2 100%   BMI 25.58 kg/m   General: NAD, pleasant Neck: Supple, no LAD Respiratory: normal work of breathing Neuro: CN II-XII grossly intact Psych: AOx3, appropriate affect  ASSESSMENT/PLAN:   Pure hypercholesterolemia LDL remains elevated at 183 despite treatment with pravastatin 10 mg.  Will discuss switching to higher intensity statin  Travel advice encounter -Malaria prophylaxis provided with malarone to take daily and start 1 to 2 days prior to travel and continue for 7 days after return.  -Typhoid capsule given that patient to take every other day 4 times per 1 week. -Patient UTD on vaccines and given  tetanus today -Patient to schedule Covid vaccine 14 days after receiving tetanus vaccine.  Acne Present under facial covering.  Patient has not tried any treatment.  We will start with OTC benzoyl peroxide products and patient to return if she has worsening or no improvement of skin.  Contraception management Patient reports that she is doing well with OCPs and would like refill of this medication.   Health maintenance Patient to schedule Pap smear.  Received tetanus today.   Instructed patient on how to schedule Covid vaccine for 14 days after receiving tetanus vaccine.  03/24/2020 Tod Abrahamsen, DO PGY-3, [facility]

## 2020-04-09 NOTE — Patient Instructions (Signed)
Thank you for coming to see me today. It was a pleasure! Today we talked about:   I have refilled your pravastatin and birth control pills.  I will send a letter with normal lab results and you will receive a phone call if anything is abnormal.  For your breakouts on your face, I recommend that you try an over-the-counter soap that has benzoyl peroxide as an ingredient.  For your travel to [place]: - I have sent in a medication for malaria that you will need to start 1 to 2 days prior to travel.  You will need to take this daily throughout your stay and continue for 7 days after you return.  -I have also sent you a medication to prevent typhoid which you will need to take every other day for 1 week, 4 tablets total.  I recommend taking this as soon as possible. -We have given you your tetanus shot today.  We are currently not doing flu shots as it is out of season. -I recommend that you have your Covid vaccine completed.  You can find where to sign up for this at the website: [email or web address]  Please follow-up in 1 year or sooner as needed.  If you have any questions or concerns, please do not hesitate to call the office at [phone].  Take Care,   Crystal Latravia [place], DO

## 2020-04-10 DIAGNOSIS — Z7184 Encounter for health counseling related to travel: Secondary | ICD-10-CM | POA: Insufficient documentation

## 2020-04-10 DIAGNOSIS — L709 Acne, unspecified: Secondary | ICD-10-CM | POA: Insufficient documentation

## 2020-04-10 DIAGNOSIS — Z309 Encounter for contraceptive management, unspecified: Secondary | ICD-10-CM | POA: Insufficient documentation

## 2020-04-10 LAB — BASIC METABOLIC PANEL
BUN/Creatinine Ratio: 11 (ref 9–23)
BUN: 7 mg/dL (ref 6–24)
CO2: 21 mmol/L (ref 20–29)
Calcium: 9.9 mg/dL (ref 8.7–10.2)
Chloride: 104 mmol/L (ref 96–106)
Creatinine, Ser: 0.66 mg/dL (ref 0.57–1.00)
GFR calc Af Amer: 123 mL/min/{1.73_m2} (ref >59)
GFR calc non Af Amer: 107 mL/min/{1.73_m2} (ref >59)
Glucose: 95 mg/dL (ref 65–99)
Potassium: 4.9 mmol/L (ref 3.5–5.2)
Sodium: 138 mmol/L (ref 134–144)

## 2020-04-10 LAB — LIPID PANEL
Chol/HDL Ratio: 5.5 ratio — ABNORMAL HIGH (ref 0.0–4.4)
Cholesterol, Total: 244 mg/dL — ABNORMAL HIGH (ref 100–199)
HDL: 44 mg/dL (ref >39)
LDL Chol Calc (NIH): 183 mg/dL — ABNORMAL HIGH (ref 0–99)
Triglycerides: 95 mg/dL (ref 0–149)
VLDL Cholesterol Cal: 17 mg/dL (ref 5–40)

## 2020-04-10 NOTE — Assessment & Plan Note (Signed)
-  Malaria prophylaxis provided with malarone to take daily and start 1 to 2 days prior to travel and continue for 7 days after return.  -Typhoid capsule given that patient to take every other day 4 times per 1 week. -Patient UTD on vaccines and given tetanus today -Patient to schedule Covid vaccine 14 days after receiving tetanus vaccine.

## 2020-04-10 NOTE — Assessment & Plan Note (Signed)
Present under facial covering.  Patient has not tried any treatment.  We will start with OTC benzoyl peroxide products and patient to return if she has worsening or no improvement of skin.

## 2020-04-10 NOTE — Assessment & Plan Note (Signed)
Patient reports that she is doing well with OCPs and would like refill of this medication.

## 2020-04-10 NOTE — Assessment & Plan Note (Signed)
LDL remains elevated at 183 despite treatment with pravastatin 10 mg.  Will discuss switching to higher intensity statin

## 2020-04-22 ENCOUNTER — Encounter: Payer: Self-pay | Admitting: Family Medicine

## 2020-05-15 ENCOUNTER — Encounter: Payer: Self-pay | Admitting: Family Medicine

## 2020-05-15 ENCOUNTER — Ambulatory Visit (INDEPENDENT_AMBULATORY_CARE_PROVIDER_SITE_OTHER): Payer: Medicaid Other | Admitting: Family Medicine

## 2020-05-15 ENCOUNTER — Other Ambulatory Visit: Payer: Self-pay

## 2020-05-15 DIAGNOSIS — L700 Acne vulgaris: Secondary | ICD-10-CM

## 2020-05-15 MED ORDER — CLINDAMYCIN PHOS-BENZOYL PEROX 1.2-5 % EX GEL: 1.0000 "application " | Freq: Every day | CUTANEOUS | 0 refills | Status: DC

## 2020-05-15 NOTE — Progress Notes (Signed)
   SUBJECTIVE:   CHIEF COMPLAINT / HPI:   Patient coming in today after receiving Covid vaccine.  She was under the understanding that she is to come to follow-up with me after receiving vaccine.  She has no other concerns.  She has not been able to pick up benzoyl peroxide for her acne and has not tried it yet.  Acne: Patient reports that she still has acne and has not tried benzyl peroxide as above.  PERTINENT  PMH / PSH: No significant past medical history  OBJECTIVE:  BP 110/60   Pulse 92   Ht 5\' 8"  (1.727 m)   Wt 166 lb (75.3 kg)   LMP 05/15/2020   SpO2 98%   BMI 25.24 kg/m   General: NAD, pleasant Neck: Supple Respiratory: normal work of breathing Psych: AOx3, appropriate affect  ASSESSMENT/PLAN:   Acne Patient again encouraged to pick up benzyl peroxide for her acne.  Also given prescription for clindamycin benzyl peroxide combination cream that she may try.   Will no charge patient for this visit given confusion that she was not to come in and had no complaints. 05/17/2020 Shirley, DO PGY-3, [place]

## 2020-05-15 NOTE — Patient Instructions (Signed)
Thank you for coming to see me today. It was a pleasure! Today we talked about:   You may obtain Tylenol over-the-counter.  You can take this after receiving your second dose of the Covid vaccine in order to prevent symptoms.  For your breakouts on your face, I recommend trying benzoyl peroxide over-the-counter.  I have sent in a prescription of benzoyl peroxide combined with an antibiotic that you can use for now.  Please follow-up with our clinic in 1 year or sooner as needed.  If you have any questions or concerns, please do not hesitate to call the office at [phone].  Take Care,   [place] Coye Dawood, DO

## 2020-05-18 NOTE — Assessment & Plan Note (Signed)
Patient again encouraged to pick up benzyl peroxide for her acne.  Also given prescription for clindamycin benzyl peroxide combination cream that she may try.

## 2021-01-05 NOTE — Progress Notes (Deleted)
    SUBJECTIVE:   CHIEF COMPLAINT / HPI:   Primary symptom:*** Duration:*** Severity:*** Associated symptoms:*** Fever? Tmax?: *** Sick contacts:*** Covid test:*** Covid vaccination(s):***  PERTINENT  PMH / PSH: ***  OBJECTIVE:   There were no vitals taken for this visit.   General: Alert, no acute distress Cardio: Normal S1 and S2, RRR, no r/m/g Pulm: CTAB, normal work of breathing Abdomen: Bowel sounds normal. Abdomen soft and non-tender.  Extremities: No peripheral edema.  Neuro: Cranial nerves grossly intact   ASSESSMENT/PLAN:   No problem-specific Assessment & Plan notes found for this encounter.     Joany Khatib, MD [facility]  

## 2021-01-05 NOTE — Patient Instructions (Incomplete)
Thank you for coming to see me today. It was a pleasure. Today we talked about:   Will test for COVID today  Follow CDC guidelines.  Self isolate for until fever has resolved for at least 24 hours  Without use of Tylenol or Ibuprofen. Wear mask at all times. If you are fever free for 24 hours you can leave your house but you need to continue to wear your mask appropriately.  You need to distance yourself from family members  Until you are symptom free.  If you develop fevers>100.5, shortness of breath, chest pain, palpitations, dizziness, abdominal pain, nausea, vomiting, diarrhea or cannot eat or drink then please go to the ER immediately.  Please follow-up with *** in ***  If you have any questions or concerns, please do not hesitate to call the office at [phone].  Best,   Dana Allan, MD

## 2021-01-06 ENCOUNTER — Ambulatory Visit: Payer: Medicaid Other

## 2021-03-02 ENCOUNTER — Other Ambulatory Visit: Payer: Self-pay

## 2021-03-02 MED ORDER — PRAVASTATIN SODIUM 10 MG PO TABS: 10.0000 mg | ORAL_TABLET | Freq: Every day | ORAL | 2 refills | Status: DC

## 2021-03-02 NOTE — Telephone Encounter (Signed)
Needs new Rx with the current PCP so patient can get medication. Dr. Talbert Forest is no longer [facility] provider. Sunday Spillers, CMA

## 2021-03-20 ENCOUNTER — Telehealth: Payer: Self-pay

## 2021-03-26 NOTE — Telephone Encounter (Signed)
Error

## 2022-01-07 ENCOUNTER — Ambulatory Visit: Payer: Medicaid Other | Admitting: Family Medicine

## 2022-01-07 ENCOUNTER — Encounter: Payer: Self-pay | Admitting: Family Medicine

## 2022-01-07 ENCOUNTER — Other Ambulatory Visit (HOSPITAL_COMMUNITY)
Admission: RE | Admit: 2022-01-07 | Discharge: 2022-01-07 | Disposition: A | Discharge status: Home / Self Care | Payer: Medicaid Other | Source: Ambulatory Visit | Attending: Family Medicine | Admitting: Family Medicine

## 2022-01-07 ENCOUNTER — Other Ambulatory Visit: Payer: Self-pay

## 2022-01-07 VITALS — BP 124/84 | HR 96 | Ht 68.0 in | Wt 174.2 lb

## 2022-01-07 DIAGNOSIS — N898 Other specified noninflammatory disorders of vagina: Secondary | ICD-10-CM | POA: Insufficient documentation

## 2022-01-07 DIAGNOSIS — Z124 Encounter for screening for malignant neoplasm of cervix: Secondary | ICD-10-CM | POA: Insufficient documentation

## 2022-01-07 LAB — POCT WET PREP (WET MOUNT)
Clue Cells Wet Prep Whiff POC: NEGATIVE
Trichomonas Wet Prep HPF POC: ABSENT

## 2022-01-07 MED ORDER — METRONIDAZOLE 500 MG PO TABS: 500.0000 mg | ORAL_TABLET | Freq: Two times a day (BID) | ORAL | 0 refills | Status: AC

## 2022-01-07 MED ORDER — PRAVASTATIN SODIUM 10 MG PO TABS: 10.0000 mg | ORAL_TABLET | Freq: Every day | ORAL | 2 refills | Status: DC

## 2022-01-07 MED ORDER — LO LOESTRIN FE 1 MG-10 MCG / 10 MCG PO TABS: 1.0000 | ORAL_TABLET | Freq: Every day | ORAL | 1 refills | Status: DC

## 2022-01-07 MED ORDER — CLINDAMYCIN PHOS-BENZOYL PEROX 1.2-5 % EX GEL: 1.0000 "application " | Freq: Every day | CUTANEOUS | 0 refills | Status: DC

## 2022-01-07 NOTE — Patient Instructions (Addendum)
It was so great seeing you today! Today we discussed the following:  - Your pap smear was done today, I will call you with the results and the follow-up plan  - Your vaginal testing today was not significant, but your pap smear may show further results. We will go ahead and start treatment and if it all comes back negative, then we can stop the medication at that time.   - Your medications refills have been sent in, please let me know if you have any issues  - Make a follow-up appointment to talk about your hair loss  Please make sure to bring any medications you take to your appointments. If you have any questions or concerns please call the office at [phone].

## 2022-01-07 NOTE — Progress Notes (Signed)
° ° °  SUBJECTIVE:   CHIEF COMPLAINT / HPI:   Interpreter Sahar [phone] used intermittently as needed for patient explanation of symptoms.   Pap smear and vaginal discharge.  Patient reports that she has been having vaginal discharge for the last 1.5 months, she reports that it is malodorous and increases with intercourse.  She has a cramping station of the lower abdomen as well that comes and goes.  And that she has been feeling a warm sensation, not necessarily feverish but warmer than usual.  She has 1 partner (her husband) and there are monogamous as far she is aware. Patient is also due for her Pap smear, she states that her last one was "a long time ago".  PERTINENT  PMH / PSH: Reviewed  OBJECTIVE:   BP 124/84    Pulse 96    Ht 5\' 8"  (1.727 m)    Wt 174 lb 3.2 oz (79 kg)    LMP 01/02/2021    SpO2 100%    BMI 26.49 kg/m   General: NAD, well-appearing, well-nourished Respiratory: No respiratory distress, breathing comfortably, able to speak in full sentences Integument: warm and dry, no rashes noted on exposed skin Psych: Appropriate affect and mood Pelvic exam: VULVA: normal appearing vulva with no masses, tenderness or lesions, VAGINA: normal appearing vagina with normal color and discharge, no lesions, CERVIX: erythematous lesion on superior side of os, cervical discharge present - creamy and green/grey light discharge mixed with cervical mucous, PAP: Pap smear done today, WET MOUNT done - results: white blood cells. Chaperoned by Ubaldo Glassing  ASSESSMENT/PLAN:   Vaginal discharge   Pap smear Patient reports malodorous vaginal discharge for the last 1.5 months.  Wet prep was not very significant, but on exam patient does have some discharge that may be consistent with either track or BV.  As patient is having concerning symptoms, we will empirically treat with metronidazole for now, we will follow-up Pap smear results including GC/CH for further cytology and will discontinue metronidazole  if appropriate. - Metronidazole 5 mg twice daily x7 days - Follow-up Pap smear - Follow-up GC/CH test  Hair loss Patient reports she has been having hair loss on the back of her head, we did not have time to address at this visit but patient was instructed to follow-up in the next week or 2 for further discussion.   Rise Patience, [place]

## 2022-01-11 LAB — CYTOLOGY - PAP
Chlamydia: NEGATIVE
Comment: NEGATIVE
Comment: NEGATIVE
Comment: NORMAL
Diagnosis: NEGATIVE
Diagnosis: REACTIVE
High risk HPV: NEGATIVE
Neisseria Gonorrhea: NEGATIVE

## 2022-01-12 ENCOUNTER — Encounter: Payer: Self-pay | Admitting: Family Medicine

## 2022-01-14 MED ORDER — ONDANSETRON HCL 4 MG/2ML IJ SOLN
INTRAMUSCULAR | Status: AC
  Filled 2022-01-14: qty 2

## 2022-01-14 MED ORDER — ARTIFICIAL TEARS OPHTHALMIC OINT
TOPICAL_OINTMENT | OPHTHALMIC | Status: AC
  Filled 2022-01-14: qty 3.5

## 2022-01-14 MED ORDER — SUCCINYLCHOLINE CHLORIDE 200 MG/10ML IV SOSY
PREFILLED_SYRINGE | INTRAVENOUS | Status: AC
  Filled 2022-01-14: qty 10

## 2022-01-14 MED ORDER — SODIUM CHLORIDE (PF) 0.9 % IJ SOLN
INTRAMUSCULAR | Status: AC
  Filled 2022-01-14: qty 10

## 2022-01-14 MED ORDER — PROPOFOL 10 MG/ML IV BOLUS
INTRAVENOUS | Status: AC
  Filled 2022-01-14: qty 20

## 2022-01-14 MED ORDER — GLYCOPYRROLATE PF 0.2 MG/ML IJ SOSY
PREFILLED_SYRINGE | INTRAMUSCULAR | Status: AC
  Filled 2022-01-14: qty 1

## 2022-01-14 MED ORDER — PHENYLEPHRINE 40 MCG/ML (10ML) SYRINGE FOR IV PUSH (FOR BLOOD PRESSURE SUPPORT)
PREFILLED_SYRINGE | INTRAVENOUS | Status: AC
  Filled 2022-01-14: qty 10

## 2022-01-14 MED ORDER — NEOSTIGMINE METHYLSULFATE 3 MG/3ML IV SOSY
PREFILLED_SYRINGE | INTRAVENOUS | Status: AC
  Filled 2022-01-14: qty 3

## 2022-01-14 MED ORDER — ROCURONIUM BROMIDE 10 MG/ML (PF) SYRINGE
PREFILLED_SYRINGE | INTRAVENOUS | Status: AC
  Filled 2022-01-14: qty 10

## 2022-01-14 MED ORDER — LIDOCAINE 2% (20 MG/ML) 5 ML SYRINGE
INTRAMUSCULAR | Status: AC
  Filled 2022-01-14: qty 5

## 2022-01-19 ENCOUNTER — Ambulatory Visit: Payer: Medicaid Other | Admitting: Family Medicine

## 2022-01-24 NOTE — Progress Notes (Addendum)
° ° ° °  SUBJECTIVE:   CHIEF COMPLAINT / HPI:   Candiss Heppler is a 46 y.o. female presents for hair loss   Hair loss Pt reports on going hair loss for 2 months on back of scalp.  Hair traction-yes. She noticed when she has her hair tied up tightly a lot of hair will fall out. Also has a small amount of hair loss on the sides of scalp. Has tried castor oil for the hair loss. Shampoo regime: head and shoulders.No recent changes in medications. Diet: normal, eating well. Stressed about mom who is sick in [place]. LMP: Jan 22nd, regular cycles. No recent childbirth. No recent weight loss.  No hx of autoimmune or thyroid disorders/iron deficiency disorder/metal poisoning/menstrual disorders. Fhx: mom has HTN.   [place] Office Visit from 01/25/2022 in [place]  PHQ-9 Total Score 0      Vaginal discharge  Explained results of STD and wet prep from previous visit. She is still having white vaginal discharge, no malodor. It is during intercourse only, otherwise she does not get it. Sexually active with husband. Does not use condoms. No changes since previous visit.  Health Maintenance Due  Topic   HIV Screening    Hepatitis C Screening    COLONOSCOPY (Pts 45-66yrs Insurance coverage will need to be confirmed)    INFLUENZA VACCINE      PERTINENT  PMH / PSH: HLD, acne   OBJECTIVE:   BP 124/88    Pulse 98    Ht 5\' 8"  (1.727 m)    Wt 172 lb 12.8 oz (78.4 kg)    LMP 01/17/2022 (Approximate)    SpO2 100%    BMI 26.27 kg/m    General: Alert, no acute distress HEEN: NCAT, no obvious hair loss, no goitre Cardio: well perfused  Pulm: normal work of breathing Neuro: Cranial nerves grossly intact   ASSESSMENT/PLAN:   Hair loss Likely due to excessive hair traction Vs telogen effluvium from recent stress. No obvious signs of patchy hair loss on exam today. Unlikely to be due to alopecia areata or androgenic alopecia. Obtained CBC, ferritin and TSH today to rule out organic  pathologies. Recommended wearing hair in looser ponytail. Follow up with PCP if no improvement.  Vaginal discharge Likely physiological or related to sexual intercourse. Explained previous visit's wet prep and STD testing which was normal. No changes since then. Reassured pt. Follow up if discharge continues.    Lattie Haw, MD PGY-3 [place]

## 2022-01-25 ENCOUNTER — Encounter: Payer: Self-pay | Admitting: Family Medicine

## 2022-01-25 ENCOUNTER — Ambulatory Visit: Payer: Medicaid Other | Admitting: Family Medicine

## 2022-01-25 ENCOUNTER — Other Ambulatory Visit: Payer: Self-pay

## 2022-01-25 VITALS — BP 124/88 | HR 98 | Ht 68.0 in | Wt 172.8 lb

## 2022-01-25 DIAGNOSIS — L659 Nonscarring hair loss, unspecified: Secondary | ICD-10-CM | POA: Diagnosis present

## 2022-01-25 DIAGNOSIS — N898 Other specified noninflammatory disorders of vagina: Secondary | ICD-10-CM | POA: Diagnosis not present

## 2022-01-25 NOTE — Patient Instructions (Signed)
Thank you for coming to see me today. It was a pleasure. Today we discussed your hair loss. I didn't see obvious hair loss. Avoid wearing hair in tight pony tail as this will pull on the hair.   Vaginal discharge can be normal during sex. Previous recent test show no abnormalities. If discharge increases come back for further testing.  We will get some labs today.  If they are abnormal or we need to do something about them, I will call you.  If they are normal, I will send you a message on MyChart (if it is active) or a letter in the mail.  If you don't hear from [place] in 2 weeks, please call the office at the number below.   Please follow-up with PCP as needed.  If you have any questions or concerns, please do not hesitate to call the office at [phone].  Best wishes,   Dr Allena Katz

## 2022-01-25 NOTE — Assessment & Plan Note (Addendum)
Likely due to excessive hair traction Vs telogen effluvium from recent stress. No obvious signs of patchy hair loss on exam today. Unlikely to be due to alopecia areata or androgenic alopecia. Obtained CBC, ferritin and TSH today to rule out organic pathologies. Recommended wearing hair in looser ponytail. Follow up with PCP if no improvement.

## 2022-01-25 NOTE — Assessment & Plan Note (Signed)
Likely physiological or related to sexual intercourse. Explained previous visit's wet prep and STD testing which was normal. No changes since then. Reassured pt. Follow up if discharge continues.

## 2022-01-26 LAB — CBC
Hematocrit: 38.9 % (ref 34.0–46.6)
Hemoglobin: 12.6 g/dL (ref 11.1–15.9)
MCH: 25 pg — ABNORMAL LOW (ref 26.6–33.0)
MCHC: 32.4 g/dL (ref 31.5–35.7)
MCV: 77 fL — ABNORMAL LOW (ref 79–97)
Platelets: 297 10*3/uL (ref 150–450)
RBC: 5.04 x10E6/uL (ref 3.77–5.28)
RDW: 15.2 % (ref 11.7–15.4)
WBC: 5.3 10*3/uL (ref 3.4–10.8)

## 2022-01-26 LAB — TSH: TSH: 1.11 u[IU]/mL (ref 0.450–4.500)

## 2022-01-26 LAB — FERRITIN: Ferritin: 11 ng/mL — ABNORMAL LOW (ref 15–150)

## 2022-02-01 ENCOUNTER — Other Ambulatory Visit: Payer: Self-pay | Admitting: Family Medicine

## 2022-02-01 ENCOUNTER — Telehealth: Payer: Self-pay | Admitting: Family Medicine

## 2022-02-01 DIAGNOSIS — Z1211 Encounter for screening for malignant neoplasm of colon: Secondary | ICD-10-CM

## 2022-02-01 MED ORDER — FERROUS SULFATE 325 (65 FE) MG PO TABS: 325.0000 mg | ORAL_TABLET | Freq: Every day | ORAL | 0 refills | Status: DC

## 2022-02-01 NOTE — Telephone Encounter (Signed)
Called pt using [place] interpretor regarding recent blood results. Explained that she has iron deficiency anemia and that she needs iron supplements. She may explain her hair loss. Also explained that given this blood result she should have a screening colonoscopy too. Placed referral and sent iron sulfate to the pharmacy. Follow up in 1 month with PCP for repeat CBC and ferritin.

## 2022-04-13 ENCOUNTER — Ambulatory Visit (AMBULATORY_SURGERY_CENTER): Payer: Self-pay | Admitting: *Deleted

## 2022-04-13 VITALS — Ht 66.0 in | Wt 167.0 lb

## 2022-04-13 DIAGNOSIS — Z1211 Encounter for screening for malignant neoplasm of colon: Secondary | ICD-10-CM

## 2022-04-13 MED ORDER — PLENVU 140 G PO SOLR: 1.0000 | ORAL | 0 refills | Status: DC

## 2022-04-13 NOTE — Progress Notes (Signed)
No egg or soy allergy known to patient  ?No issues known to pt with past sedation with any surgeries or procedures ?Patient denies ever being told they had issues or difficulty with intubation  ?No FH of Malignant Hyperthermia ?Pt is not on diet pills ?Pt is not on  home 02  ?Pt is not on blood thinners  ?Pt denies issues with constipation  ?No A fib or A flutter ? ?Arabic interpreter in PV - PV done over the phone today  ?PV instructions to pt in English as her son and daughter speak/ read [place] and Also sent in Arabic for pt  ? ?NO PA's for preps discussed with pt In PV today  ?Discussed with pt there will be an out-of-pocket cost for prep and that varies from $0 to 70 +  dollars - pt verbalized understanding  ?Pt instructed to use Singlecare.com or GoodRx for a price reduction on prep  ? ?PV completed over the phone. Pt verified name, DOB, address and insurance during PV today.  ?Pt mailed instruction packet with copy of consent form to read and not return, and instructions.  ?Pt encouraged to call with questions or issues.  ?If pt has My chart, procedure instructions sent via My Chart  ? ?

## 2022-04-29 ENCOUNTER — Encounter: Payer: Self-pay | Admitting: Gastroenterology

## 2022-04-29 ENCOUNTER — Ambulatory Visit (AMBULATORY_SURGERY_CENTER): Payer: Medicaid Other | Admitting: Gastroenterology

## 2022-04-29 VITALS — BP 124/83 | HR 88 | Temp 99.1°F | Resp 14 | Ht 68.0 in | Wt 172.0 lb

## 2022-04-29 DIAGNOSIS — Z1211 Encounter for screening for malignant neoplasm of colon: Secondary | ICD-10-CM

## 2022-04-29 MED ORDER — SODIUM CHLORIDE 0.9 % IV SOLN: 500.0000 mL | Freq: Once | INTRAVENOUS | Status: DC

## 2022-04-29 NOTE — Progress Notes (Signed)
VS  DT ? ?Pt's states no medical or surgical changes since previsit or office visit. ? ?

## 2022-04-29 NOTE — Progress Notes (Signed)
[facility] History and Physical ? ? ?Primary Care Physician:  Alcus Dad, MD ? ? ?Reason for Procedure:  Colorectal cancer screening ? ?Plan:    Screening colonoscopy with possible interventions as needed ? ? ? ? ?HPI: [name] is a very pleasant 46 y.o. female here for screening colonoscopy. ? ?Denies any nausea, vomiting, abdominal pain, melena or bright red blood per rectum ? ?The risks and benefits as well as alternatives of endoscopic procedure(s) have been discussed and reviewed. All questions answered. The patient agrees to proceed. ? ? ? ?Past Medical History:  ?Diagnosis Date  ? Allergy   ? seasonal  ? Anemia   ? Hypercholesteremia   ? Hypertension   ? ? ?Past Surgical History:  ?Procedure Laterality Date  ? CESAREAN SECTION    ? 2  ? ? ?Prior to Admission medications   ?Medication Sig Start Date End Date Taking? Authorizing Provider  ?pravastatin (PRAVACHOL) 10 MG tablet Take 1 tablet (10 mg total) by mouth daily. 01/07/22  Yes Lilland, Alana, DO  ?atovaquone-proguanil (MALARONE) 250-100 MG TABS tablet start 1 to 2 days prior to travel and continue throughout the stay and for 7 days after returning. 04/09/20   Shirley, [place], DO  ?Clindamycin-Benzoyl Per, Refr, gel Apply 1 application topically daily. 01/07/22   Rise Patience, DO  ? ? ?Current Outpatient Medications  ?Medication Sig Dispense Refill  ? pravastatin (PRAVACHOL) 10 MG tablet Take 1 tablet (10 mg total) by mouth daily. 90 tablet 2  ? atovaquone-proguanil (MALARONE) 250-100 MG TABS tablet start 1 to 2 days prior to travel and continue throughout the stay and for 7 days after returning. 90 tablet 0  ? Clindamycin-Benzoyl Per, Refr, gel Apply 1 application topically daily. 45 g 0  ? ?Current Facility-Administered Medications  ?Medication Dose Route Frequency Provider Last Rate Last Admin  ? 0.9 %  sodium chloride infusion  500 mL Intravenous Once Zannah Melucci, Venia Minks, MD      ? ? ?Allergies as of 04/29/2022  ? (No Known  Allergies)  ? ? ?Family History  ?Problem Relation Age of Onset  ? High blood pressure Mother   ? Colon cancer Neg Hx   ? Colon polyps Neg Hx   ? Esophageal cancer Neg Hx   ? Rectal cancer Neg Hx   ? Stomach cancer Neg Hx   ? ? ?Social History  ? ?Socioeconomic History  ? Marital status: Married  ?  Spouse name: Not on file  ? Number of children: Not on file  ? Years of education: Not on file  ? Highest education level: Not on file  ?Occupational History  ? Not on file  ?Tobacco Use  ? Smoking status: Never  ? Smokeless tobacco: Never  ?Substance and Sexual Activity  ? Alcohol use: Never  ? Drug use: Never  ? Sexual activity: Not on file  ?Other Topics Concern  ? Not on file  ?Social History Narrative  ? Not on file  ? ?Social Determinants of Health  ? ?Financial Resource Strain: Not on file  ?Food Insecurity: Not on file  ?Transportation Needs: Not on file  ?Physical Activity: Not on file  ?Stress: Not on file  ?Social Connections: Not on file  ?Intimate Partner Violence: Not on file  ? ? ?Review of Systems: ? ?All other review of systems negative except as mentioned in the HPI. ? ?Physical Exam: ?Vital signs in last 24 hours: ?BP 129/85   Pulse (!) 112   Temp 99.1 ?F (37.3 ?  C)   Ht 5\' 8"  (1.727 m)   Wt 172 lb (78 kg)   LMP 04/25/2022   SpO2 98%   BMI 26.15 kg/m?  ?General:   Alert, NAD ?Lungs:  Clear .   ?Heart:  Regular rate and rhythm ?Abdomen:  Soft, nontender and nondistended. ?Neuro/Psych:  Alert and cooperative. Normal mood and affect. A and O x 3 ? ?Reviewed labs, radiology imaging, old records and pertinent past GI work up ? ?Patient is appropriate for planned procedure(s) and anesthesia in an ambulatory setting ? ? ?K. Denzil Magnuson , MD ?[phone]  ? ? ?  ?

## 2022-04-29 NOTE — Progress Notes (Signed)
Pt non-responsive, VVS, Report to RN  °

## 2022-04-29 NOTE — Patient Instructions (Signed)
Repeat colonoscopy in 10 years  ? ?OU HAD AN ENDOSCOPIC PROCEDURE TODAY AT THE [facility]:   Refer to the procedure report that was given to you for any specific questions about what was found during the examination.  If the procedure report does not answer your questions, please call your gastroenterologist to clarify.  If you requested that your care partner not be given the details of your procedure findings, then the procedure report has been included in a sealed envelope for you to review at your convenience later. ? ?YOU SHOULD EXPECT: Some feelings of bloating in the abdomen. Passage of more gas than usual.  Walking can help get rid of the air that was put into your GI tract during the procedure and reduce the bloating. If you had a lower endoscopy (such as a colonoscopy or flexible sigmoidoscopy) you may notice spotting of blood in your stool or on the toilet paper. If you underwent a bowel prep for your procedure, you may not have a normal bowel movement for a few days. ? ?Please Note:  You might notice some irritation and congestion in your nose or some drainage.  This is from the oxygen used during your procedure.  There is no need for concern and it should clear up in a day or so. ? ?SYMPTOMS TO REPORT IMMEDIATELY: ? ?Following lower endoscopy (colonoscopy or flexible sigmoidoscopy): ? Excessive amounts of blood in the stool ? Significant tenderness or worsening of abdominal pains ? Swelling of the abdomen that is new, acute ? Fever of 100?F or higher ? ?For urgent or emergent issues, a gastroenterologist can be reached at any hour by calling [phone]. ?Do not use MyChart messaging for urgent concerns.  ? ? ?DIET:  We do recommend a small meal at first, but then you may proceed to your regular diet.  Drink plenty of fluids but you should avoid alcoholic beverages for 24 hours. ? ?ACTIVITY:  You should plan to take it easy for the rest of today and you should NOT DRIVE or use heavy  machinery until tomorrow (because of the sedation medicines used during the test).   ? ?FOLLOW UP: ?Our staff will call the number listed on your records 48-72 hours following your procedure to check on you and address any questions or concerns that you may have regarding the information given to you following your procedure. If we do not reach you, we will leave a message.  We will attempt to reach you two times.  During this call, we will ask if you have developed any symptoms of COVID 19. If you develop any symptoms (ie: fever, flu-like symptoms, shortness of breath, cough etc.) before then, please call [phone].  If you test positive for Covid 19 in the 2 weeks post procedure, please call and report this information to [place].   ? ?If any biopsies were taken you will be contacted by phone or by letter within the next 1-3 weeks.  Please call us at [phone] if you have not heard about the biopsies in 3 weeks.  ? ? ?SIGNATURES/CONFIDENTIALITY: ?You and/or your care partner have signed paperwork which will be entered into your electronic medical record.  These signatures attest to the fact that that the information above on your After Visit Summary has been reviewed and is understood.  Full responsibility of the confidentiality of this discharge information lies with you and/or your care-partner. ? ?

## 2022-04-29 NOTE — Op Note (Signed)
[facility] ?Patient Name: [name] ?Procedure Date: 04/29/2022 12:13 PM ?MRN: [id number] ?Endoscopist: Napoleon Form , MD ?Age: 46 ?Referring MD:  ?Date of Birth: 04/10/1976 ?Gender: Female ?Account #: [id number] ?Procedure:                Colonoscopy ?Indications:              Screening for colorectal malignant neoplasm ?Medicines:                Monitored Anesthesia Care ?Procedure:                Pre-Anesthesia Assessment: ?                          - Prior to the procedure, a History and Physical  ?                          was performed, and patient medications and  ?                          allergies were reviewed. The patient's tolerance of  ?                          previous anesthesia was also reviewed. The risks  ?                          and benefits of the procedure and the sedation  ?                          options and risks were discussed with the patient.  ?                          All questions were answered, and informed consent  ?                          was obtained. Prior Anticoagulants: The patient has  ?                          taken no previous anticoagulant or antiplatelet  ?                          agents. ASA Grade Assessment: II - A patient with  ?                          mild systemic disease. After reviewing the risks  ?                          and benefits, the patient was deemed in  ?                          satisfactory condition to undergo the procedure. ?                          After obtaining informed consent, the colonoscope  ?  was passed under direct vision. Throughout the  ?                          procedure, the patient's blood pressure, pulse, and  ?                          oxygen saturations were monitored continuously. The  ?                          PCF-HQ190L Colonoscope was introduced through the  ?                          anus and advanced to the the cecum, identified by  ?                          appendiceal  orifice and ileocecal valve. The  ?                          colonoscopy was performed without difficulty. The  ?                          patient tolerated the procedure well. The quality  ?                          of the bowel preparation was excellent. The  ?                          ileocecal valve, appendiceal orifice, and rectum  ?                          were photographed. ?Scope In: 12:20:34 PM ?Scope Out: 12:29:55 PM ?Scope Withdrawal Time: 0 hours 6 minutes 46 seconds  ?Total Procedure Duration: 0 hours 9 minutes 21 seconds  ?Findings:                 The perianal and digital rectal examinations were  ?                          normal. ?                          Non-bleeding external and internal hemorrhoids were  ?                          found during retroflexion. The hemorrhoids were  ?                          small. ?                          The exam was otherwise without abnormality. ?Complications:            No immediate complications. ?Estimated Blood Loss:     Estimated blood loss was minimal. ?Impression:               - Non-bleeding external and internal hemorrhoids. ?                          -  The examination was otherwise normal. ?                          - No specimens collected. ?Recommendation:           - Patient has a contact number available for  ?                          emergencies. The signs and symptoms of potential  ?                          delayed complications were discussed with the  ?                          patient. Return to normal activities tomorrow.  ?                          Written discharge instructions were provided to the  ?                          patient. ?                          - Resume previous diet. ?                          - Continue present medications. ?                          - Repeat colonoscopy in 10 years for surveillance. ?Napoleon Form, MD ?04/29/2022 12:33:17 PM ?This report has been signed electronically. ?

## 2022-05-03 ENCOUNTER — Telehealth: Payer: Self-pay | Admitting: *Deleted

## 2022-05-03 NOTE — Telephone Encounter (Signed)
?  Follow up Call- ? ? ?  04/29/2022  ? 11:11 AM  ?Call back number  ?Post procedure Call Back phone  # [phone]  ?Permission to leave phone message Yes  ?  ? ?Patient questions: ? ?Do you have a fever, pain , or abdominal swelling? No. ?Pain Score  0 * ? ?Have you tolerated food without any problems? Yes.   ? ?Have you been able to return to your normal activities? Yes.   ? ?Do you have any questions about your discharge instructions: ?Diet   No. ?Medications  No. ?Follow up visit  No. ? ?Do you have questions or concerns about your Care? No. ? ?Actions: ?* If pain score is 4 or above: ?No action needed, pain <4. ? ? ?

## 2022-11-16 ENCOUNTER — Ambulatory Visit: Payer: Medicaid Other

## 2023-09-28 ENCOUNTER — Ambulatory Visit: Payer: Medicaid Other | Admitting: Student

## 2023-09-28 ENCOUNTER — Encounter: Payer: Self-pay | Admitting: Student

## 2023-09-28 VITALS — BP 126/84 | HR 96 | Ht 66.0 in | Wt 172.2 lb

## 2023-09-28 DIAGNOSIS — Z0000 Encounter for general adult medical examination without abnormal findings: Secondary | ICD-10-CM | POA: Diagnosis present

## 2023-09-28 DIAGNOSIS — E7800 Pure hypercholesterolemia, unspecified: Secondary | ICD-10-CM | POA: Diagnosis not present

## 2023-09-28 DIAGNOSIS — H6123 Impacted cerumen, bilateral: Secondary | ICD-10-CM

## 2023-09-28 DIAGNOSIS — Z131 Encounter for screening for diabetes mellitus: Secondary | ICD-10-CM

## 2023-09-28 MED ORDER — CLINDAMYCIN PHOS-BENZOYL PEROX 1.2-5 % EX GEL: 1.0000 "application " | Freq: Every day | CUTANEOUS | 0 refills | Status: AC

## 2023-09-28 MED ORDER — PRAVASTATIN SODIUM 10 MG PO TABS: 10.0000 mg | ORAL_TABLET | Freq: Every day | ORAL | 2 refills | Status: AC

## 2023-09-28 MED ORDER — DEBROX 6.5 % OT SOLN
5.0000 [drp] | Freq: Two times a day (BID) | OTIC | 0 refills | Status: AC
Start: 2023-09-28 — End: ?

## 2023-09-28 NOTE — Patient Instructions (Signed)
I am checking lab work on you today. I will either call you with the results or send you a letter in the mail.   You are due for a Flu shot, you can receive this at our office or the pharmacy.    No future appointments.  Please arrive 15 minutes before your appointment to ensure smooth check in process.    Please call the clinic at [phone] if your symptoms worsen or you have any concerns.  Thank you for allowing me to participate in your care, Dr. Glendale Chard [facility]

## 2023-09-28 NOTE — Progress Notes (Signed)
    SUBJECTIVE:   CHIEF COMPLAINT / HPI:   [name] is a 47 y.o. female  presenting for follow-up for hyperlipidemia and hypertension.  Patient has been compliant with her cholesterol medication denies side effects.  PERTINENT  PMH / PSH: Reviewed and updated   OBJECTIVE:   BP 126/84   Pulse 96   Ht 5\' 6"  (1.676 m)   Wt 172 lb 3.2 oz (78.1 kg)   SpO2 100%   BMI 27.79 kg/m   Well-appearing, no acute distress HEENT: Bilateral ears with hard cerumen impaction blocking TM from visualization Cardio: Regular rate, regular rhythm, no murmurs on exam. Pulm: Clear, no wheezing, no crackles. No increased work of breathing Abdominal: bowel sounds present, soft, non-tender, non-distended Extremities: no peripheral edema  Neuro: alert and oriented x3, speech normal in content, no facial asymmetry, strength intact and equal bilaterally in UE and LE, pupils equal and reactive to light.  Psych:  Cognition and judgment appear intact. Alert, communicative  and cooperative with normal attention span and concentration. No apparent delusions, illusions, hallucinations      09/28/2023    9:16 AM 01/25/2022    9:34 AM 01/07/2022    9:37 AM  PHQ9 SCORE ONLY  PHQ-9 Total Score 1 0 0      ASSESSMENT/PLAN:   Pure hypercholesterolemia Refill pravastatin 10 mg daily Check lipid panel today  Cerumen impaction: Prescribed Debrox to be used in ears cautioned patient on using Q-tips Follow-up as needed   Healthcare maintenance: Screen for diabetes with A1c Check kidney function with BMP and glucose Screening for HIV and hep C completed today  Glendale Chard, DO [facility]

## 2023-09-28 NOTE — Assessment & Plan Note (Signed)
Refill pravastatin 10 mg daily Check lipid panel today

## 2023-09-29 LAB — LIPID PANEL
Chol/HDL Ratio: 6.5 {ratio} — ABNORMAL HIGH (ref 0.0–4.4)
Cholesterol, Total: 239 mg/dL — ABNORMAL HIGH (ref 100–199)
HDL: 37 mg/dL — ABNORMAL LOW (ref >39)
LDL Chol Calc (NIH): 153 mg/dL — ABNORMAL HIGH (ref 0–99)
Triglycerides: 262 mg/dL — ABNORMAL HIGH (ref 0–149)
VLDL Cholesterol Cal: 49 mg/dL — ABNORMAL HIGH (ref 5–40)

## 2023-09-29 LAB — BASIC METABOLIC PANEL
BUN/Creatinine Ratio: 12 (ref 9–23)
BUN: 8 mg/dL (ref 6–24)
CO2: 23 mmol/L (ref 20–29)
Calcium: 9.7 mg/dL (ref 8.7–10.2)
Chloride: 103 mmol/L (ref 96–106)
Creatinine, Ser: 0.68 mg/dL (ref 0.57–1.00)
Glucose: 116 mg/dL — ABNORMAL HIGH (ref 70–99)
Potassium: 4.4 mmol/L (ref 3.5–5.2)
Sodium: 140 mmol/L (ref 134–144)
eGFR: 107 mL/min/{1.73_m2} (ref >59)

## 2023-09-29 LAB — HIV ANTIBODY (ROUTINE TESTING W REFLEX): HIV Screen 4th Generation wRfx: NONREACTIVE

## 2023-09-29 LAB — HEMOGLOBIN A1C
Est. average glucose Bld gHb Est-mCnc: 137 mg/dL
Hgb A1c MFr Bld: 6.4 % — ABNORMAL HIGH (ref 4.8–5.6)

## 2023-09-29 LAB — HEPATITIS C ANTIBODY: Hep C Virus Ab: NONREACTIVE

## 2023-09-30 ENCOUNTER — Encounter (HOSPITAL_BASED_OUTPATIENT_CLINIC_OR_DEPARTMENT_OTHER): Payer: Self-pay | Admitting: Student
